# Patient Record
Sex: Male | Born: 1985 | Race: Black or African American | Hispanic: No | Marital: Married | Smoking: Never smoker
Health system: Southern US, Community
[De-identification: ages and names within clinical notes are randomized; demographics above are authoritative.]

## PROBLEM LIST (undated history)

## (undated) DIAGNOSIS — J45909 Unspecified asthma, uncomplicated: Secondary | ICD-10-CM

## (undated) DIAGNOSIS — N4400 Torsion of testis, unspecified: Secondary | ICD-10-CM

## (undated) HISTORY — PX: OTHER SURGICAL HISTORY: SHX169

## (undated) HISTORY — PX: FINGER AMPUTATION: SHX636

---
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
## 2009-05-30 ENCOUNTER — Emergency Department: Payer: Self-pay | Admitting: Emergency Medicine

## 2015-08-18 ENCOUNTER — Emergency Department (HOSPITAL_COMMUNITY): Payer: Self-pay

## 2015-08-18 ENCOUNTER — Emergency Department (HOSPITAL_COMMUNITY): Payer: Self-pay | Admitting: Certified Registered Nurse Anesthetist

## 2015-08-18 ENCOUNTER — Observation Stay (HOSPITAL_COMMUNITY)
Admission: EM | Admit: 2015-08-18 | Discharge: 2015-08-19 | Disposition: A | Discharge status: Home / Self Care | Payer: Self-pay | Attending: Orthopedic Surgery | Admitting: Orthopedic Surgery

## 2015-08-18 ENCOUNTER — Encounter (HOSPITAL_COMMUNITY)
Admission: EM | Disposition: A | Discharge status: Home / Self Care | Payer: Self-pay | Source: Home / Self Care | Attending: Emergency Medicine

## 2015-08-18 ENCOUNTER — Encounter (HOSPITAL_COMMUNITY): Payer: Self-pay | Admitting: *Deleted

## 2015-08-18 DIAGNOSIS — S6722XA Crushing injury of left hand, initial encounter: Principal | ICD-10-CM | POA: Insufficient documentation

## 2015-08-18 DIAGNOSIS — Y9389 Activity, other specified: Secondary | ICD-10-CM | POA: Insufficient documentation

## 2015-08-18 DIAGNOSIS — S61209A Unspecified open wound of unspecified finger without damage to nail, initial encounter: Secondary | ICD-10-CM

## 2015-08-18 DIAGNOSIS — Y9269 Other specified industrial and construction area as the place of occurrence of the external cause: Secondary | ICD-10-CM | POA: Insufficient documentation

## 2015-08-18 DIAGNOSIS — S68119A Complete traumatic metacarpophalangeal amputation of unspecified finger, initial encounter: Secondary | ICD-10-CM | POA: Diagnosis present

## 2015-08-18 DIAGNOSIS — W230XXA Caught, crushed, jammed, or pinched between moving objects, initial encounter: Secondary | ICD-10-CM | POA: Insufficient documentation

## 2015-08-18 DIAGNOSIS — S61319A Laceration without foreign body of unspecified finger with damage to nail, initial encounter: Secondary | ICD-10-CM

## 2015-08-18 DIAGNOSIS — S68627A Partial traumatic transphalangeal amputation of left little finger, initial encounter: Secondary | ICD-10-CM | POA: Insufficient documentation

## 2015-08-18 DIAGNOSIS — S62635B Displaced fracture of distal phalanx of left ring finger, initial encounter for open fracture: Secondary | ICD-10-CM | POA: Insufficient documentation

## 2015-08-18 DIAGNOSIS — Y990 Civilian activity done for income or pay: Secondary | ICD-10-CM | POA: Insufficient documentation

## 2015-08-18 HISTORY — PX: HAND SURGERY: SHX662

## 2015-08-18 HISTORY — DX: Unspecified asthma, uncomplicated: J45.909

## 2015-08-18 HISTORY — PX: I & D EXTREMITY: SHX5045

## 2015-08-18 HISTORY — DX: Torsion of testis, unspecified: N44.00

## 2015-08-18 SURGERY — IRRIGATION AND DEBRIDEMENT EXTREMITY
Anesthesia: General | Site: Hand | Laterality: Left

## 2015-08-18 MED ORDER — BUPIVACAINE HCL (PF) 0.25 % IJ SOLN
INTRAMUSCULAR | Status: DC | PRN
  Administered 2015-08-18: 6 mL

## 2015-08-18 MED ORDER — ONDANSETRON HCL 4 MG/2ML IJ SOLN
INTRAMUSCULAR | Status: DC | PRN
  Administered 2015-08-18: 4 mg via INTRAVENOUS

## 2015-08-18 MED ORDER — TETANUS-DIPHTHERIA TOXOIDS TD 5-2 LFU IM INJ: 0.5000 mL | INJECTION | Freq: Once | INTRAMUSCULAR | Status: DC

## 2015-08-18 MED ORDER — HYDROMORPHONE HCL 1 MG/ML IJ SOLN
1.0000 mg | INTRAMUSCULAR | Status: DC | PRN
  Administered 2015-08-19: 1 mg via INTRAVENOUS
  Filled 2015-08-18: qty 1

## 2015-08-18 MED ORDER — CEFAZOLIN IN D5W 1 GM/50ML IV SOLN
1.0000 g | Freq: Once | INTRAVENOUS | Status: AC
  Administered 2015-08-18: 1 g via INTRAVENOUS
  Filled 2015-08-18: qty 50

## 2015-08-18 MED ORDER — LACTATED RINGERS IV SOLN
INTRAVENOUS | Status: DC | PRN
  Administered 2015-08-18 (×2): via INTRAVENOUS

## 2015-08-18 MED ORDER — SODIUM CHLORIDE 0.9 % IR SOLN
Status: DC | PRN
  Administered 2015-08-18: 3000 mL

## 2015-08-18 MED ORDER — LIDOCAINE HCL (CARDIAC) 20 MG/ML IV SOLN
INTRAVENOUS | Status: DC | PRN
  Administered 2015-08-18: 50 mg via INTRAVENOUS

## 2015-08-18 MED ORDER — LIDOCAINE 2% (20 MG/ML) 5 ML SYRINGE
INTRAMUSCULAR | Status: AC
  Filled 2015-08-18: qty 5

## 2015-08-18 MED ORDER — TETANUS-DIPHTH-ACELL PERTUSSIS 5-2.5-18.5 LF-MCG/0.5 IM SUSP
0.5000 mL | Freq: Once | INTRAMUSCULAR | Status: AC
  Administered 2015-08-18: 0.5 mL via INTRAMUSCULAR
  Filled 2015-08-18: qty 0.5

## 2015-08-18 MED ORDER — ONDANSETRON HCL 4 MG/2ML IJ SOLN
INTRAMUSCULAR | Status: AC
  Filled 2015-08-18: qty 2

## 2015-08-18 MED ORDER — ONDANSETRON HCL 4 MG/2ML IJ SOLN
4.0000 mg | Freq: Once | INTRAMUSCULAR | Status: AC
  Administered 2015-08-18: 4 mg via INTRAVENOUS
  Filled 2015-08-18: qty 2

## 2015-08-18 MED ORDER — HYDROMORPHONE HCL 1 MG/ML IJ SOLN
1.0000 mg | Freq: Once | INTRAMUSCULAR | Status: AC
  Administered 2015-08-18: 1 mg via INTRAVENOUS
  Filled 2015-08-18: qty 1

## 2015-08-18 MED ORDER — HYDROMORPHONE HCL 1 MG/ML IJ SOLN: 0.2500 mg | INTRAMUSCULAR | Status: DC | PRN

## 2015-08-18 MED ORDER — CEFAZOLIN IN D5W 1 GM/50ML IV SOLN
INTRAVENOUS | Status: DC | PRN
  Administered 2015-08-18: 1 g via INTRAVENOUS

## 2015-08-18 MED ORDER — BUPIVACAINE HCL (PF) 0.25 % IJ SOLN
INTRAMUSCULAR | Status: AC
  Filled 2015-08-18: qty 30

## 2015-08-18 MED ORDER — SUCCINYLCHOLINE CHLORIDE 200 MG/10ML IV SOSY
PREFILLED_SYRINGE | INTRAVENOUS | Status: AC
  Filled 2015-08-18: qty 10

## 2015-08-18 MED ORDER — PROPOFOL 10 MG/ML IV BOLUS
INTRAVENOUS | Status: DC | PRN
  Administered 2015-08-18: 150 mg via INTRAVENOUS

## 2015-08-18 MED ORDER — PROPOFOL 10 MG/ML IV BOLUS
INTRAVENOUS | Status: AC
  Filled 2015-08-18: qty 20

## 2015-08-18 MED ORDER — MIDAZOLAM HCL 5 MG/5ML IJ SOLN
INTRAMUSCULAR | Status: DC | PRN
  Administered 2015-08-18: 2 mg via INTRAVENOUS

## 2015-08-18 MED ORDER — FENTANYL CITRATE (PF) 100 MCG/2ML IJ SOLN
INTRAMUSCULAR | Status: DC | PRN
Start: 2015-08-18 — End: 2015-08-18
  Administered 2015-08-18: 100 ug via INTRAVENOUS

## 2015-08-18 MED ORDER — FENTANYL CITRATE (PF) 250 MCG/5ML IJ SOLN
INTRAMUSCULAR | Status: AC
  Filled 2015-08-18: qty 5

## 2015-08-18 MED ORDER — LACTATED RINGERS IV SOLN
INTRAVENOUS | Status: DC
  Administered 2015-08-18: 17:00:00 via INTRAVENOUS

## 2015-08-18 SURGICAL SUPPLY — 38 items
BANDAGE ELASTIC 4 VELCRO ST LF (GAUZE/BANDAGES/DRESSINGS) ×3 IMPLANT
BNDG CONFORM 2 STRL LF (GAUZE/BANDAGES/DRESSINGS) ×3 IMPLANT
BNDG GAUZE ELAST 4 BULKY (GAUZE/BANDAGES/DRESSINGS) ×9 IMPLANT
CORDS BIPOLAR (ELECTRODE) ×3 IMPLANT
CUFF TOURNIQUET SINGLE 18IN (TOURNIQUET CUFF) ×3 IMPLANT
DRSG ADAPTIC 3X8 NADH LF (GAUZE/BANDAGES/DRESSINGS) ×3 IMPLANT
GAUZE SPONGE 4X4 12PLY STRL (GAUZE/BANDAGES/DRESSINGS) ×3 IMPLANT
GAUZE XEROFORM 5X9 LF (GAUZE/BANDAGES/DRESSINGS) ×3 IMPLANT
GLOVE BIOGEL M 8.0 STRL (GLOVE) ×3 IMPLANT
GLOVE SS BIOGEL STRL SZ 8 (GLOVE) ×1 IMPLANT
GLOVE SUPERSENSE BIOGEL SZ 8 (GLOVE) ×2
GOWN STRL REUS W/ TWL LRG LVL3 (GOWN DISPOSABLE) ×1 IMPLANT
GOWN STRL REUS W/ TWL XL LVL3 (GOWN DISPOSABLE) ×2 IMPLANT
GOWN STRL REUS W/TWL LRG LVL3 (GOWN DISPOSABLE) ×2
GOWN STRL REUS W/TWL XL LVL3 (GOWN DISPOSABLE) ×4
KIT BASIN OR (CUSTOM PROCEDURE TRAY) ×3 IMPLANT
KIT ROOM TURNOVER OR (KITS) ×3 IMPLANT
MANIFOLD NEPTUNE II (INSTRUMENTS) ×3 IMPLANT
NEEDLE HYPO 25GX1X1/2 BEV (NEEDLE) ×3 IMPLANT
NS IRRIG 1000ML POUR BTL (IV SOLUTION) ×3 IMPLANT
PACK ORTHO EXTREMITY (CUSTOM PROCEDURE TRAY) ×3 IMPLANT
PAD ARMBOARD 7.5X6 YLW CONV (MISCELLANEOUS) ×3 IMPLANT
PAD CAST 4YDX4 CTTN HI CHSV (CAST SUPPLIES) ×1 IMPLANT
PADDING CAST COTTON 4X4 STRL (CAST SUPPLIES) ×2
PADDING CAST SYNTHETIC 3 NS LF (CAST SUPPLIES) ×2
PADDING CAST SYNTHETIC 3X4 NS (CAST SUPPLIES) ×1 IMPLANT
SPLINT FIBERGLASS 3X12 (CAST SUPPLIES) ×3 IMPLANT
SPONGE LAP 4X18 X RAY DECT (DISPOSABLE) ×3 IMPLANT
SUT CHROMIC 6 0 PS 4 (SUTURE) ×6 IMPLANT
SUT PROLENE 4 0 P 3 18 (SUTURE) ×3 IMPLANT
SUT PROLENE 5 0 PS 2 (SUTURE) ×3 IMPLANT
SYR CONTROL 10ML LL (SYRINGE) ×3 IMPLANT
TOWEL OR 17X24 6PK STRL BLUE (TOWEL DISPOSABLE) ×3 IMPLANT
TOWEL OR 17X26 10 PK STRL BLUE (TOWEL DISPOSABLE) ×3 IMPLANT
TUBE CONNECTING 12'X1/4 (SUCTIONS) ×1
TUBE CONNECTING 12X1/4 (SUCTIONS) ×2 IMPLANT
WATER STERILE IRR 1000ML POUR (IV SOLUTION) ×3 IMPLANT
YANKAUER SUCT BULB TIP NO VENT (SUCTIONS) ×3 IMPLANT

## 2015-08-18 NOTE — ED Triage Notes (Signed)
Pt arrives via [facility]. Pt was at work and got his left hand caught in a cement truck. Pt had an amputation to the left pinky and a partial amputation to the left ring finger. Pt BP was soft and he was unable to receive pain meds PTA.

## 2015-08-18 NOTE — Transfer of Care (Signed)
Immediate Anesthesia Transfer of Care Note  Patient: Evan Landry  Procedure(s) Performed: Procedure(s) with comments: IRRIGATION AND DEBRIDEMENT OF HAND REVISION AND REPAIR AS NECESSARY (Left) - IRRIGATION AND DEBRIDEMENT OF HAND REVISION AND REPAIR AS NECESSARY  Patient Location: PACU  Anesthesia Type:General  Level of Consciousness: awake  Airway & Oxygen Therapy: Patient Spontanous Breathing and Patient connected to face mask oxygen  Post-op Assessment: Report given to RN and Post -op Vital signs reviewed and stable  Post vital signs: Reviewed and stable  Last Vitals:  Vitals:   08/18/15 1600 08/18/15 2103  BP: 114/71   Pulse: 68   Resp: 16   Temp:  (P) 36.4 C    Last Pain:  Vitals:   08/18/15 1454  PainSc: 6          Complications: No apparent anesthesia complications

## 2015-08-18 NOTE — Anesthesia Postprocedure Evaluation (Signed)
Anesthesia Post Note  Patient: Evan Landry  Procedure(s) Performed: Procedure(s) (LRB): IRRIGATION AND DEBRIDEMENT OF HAND REVISION AND REPAIR AS NECESSARY (Left)  Patient location during evaluation: PACU Anesthesia Type: General Level of consciousness: awake and alert Pain management: pain level controlled Vital Signs Assessment: post-procedure vital signs reviewed and stable Respiratory status: spontaneous breathing, nonlabored ventilation and respiratory function stable Cardiovascular status: blood pressure returned to baseline and stable Postop Assessment: no signs of nausea or vomiting Anesthetic complications: no    Last Vitals:  Vitals:   08/18/15 2130 08/18/15 2133  BP:  117/72  Pulse: (!) 49 (!) 48  Resp: 15 17  Temp:  36.6 C    Last Pain:  Vitals:   08/18/15 2133  PainSc: Asleep                 Amaira Safley,W. EDMOND

## 2015-08-18 NOTE — ED Notes (Signed)
Patient transported to X-ray 

## 2015-08-18 NOTE — Anesthesia Preprocedure Evaluation (Addendum)
Anesthesia Evaluation  Patient identified by MRN, date of birth, ID band Patient awake    Reviewed: Allergy & Precautions, H&P , NPO status , Patient's Chart, lab work & pertinent test results  Airway Mallampati: II  TM Distance: >3 FB Neck ROM: Full    Dental no notable dental hx. (+) Teeth Intact, Dental Advisory Given   Pulmonary neg pulmonary ROS,    Pulmonary exam normal breath sounds clear to auscultation       Cardiovascular negative cardio ROS   Rhythm:Regular Rate:Normal     Neuro/Psych negative neurological ROS  negative psych ROS   GI/Hepatic negative GI ROS, Neg liver ROS,   Endo/Other  negative endocrine ROS  Renal/GU negative Renal ROS  negative genitourinary   Musculoskeletal   Abdominal   Peds  Hematology negative hematology ROS (+)   Anesthesia Other Findings   Reproductive/Obstetrics negative OB ROS                            Anesthesia Physical Anesthesia Plan  ASA: I  Anesthesia Plan: General   Post-op Pain Management:    Induction: Intravenous  Airway Management Planned: LMA  Additional Equipment:   Intra-op Plan:   Post-operative Plan: Extubation in OR  Informed Consent: I have reviewed the patients History and Physical, chart, labs and discussed the procedure including the risks, benefits and alternatives for the proposed anesthesia with the patient or authorized representative who has indicated his/her understanding and acceptance.   Dental advisory given  Plan Discussed with: CRNA  Anesthesia Plan Comments:        Anesthesia Quick Evaluation  

## 2015-08-18 NOTE — ED Provider Notes (Addendum)
[facility]-EMERGENCY DEPT Provider Note   CSN: [id number] Arrival date & time: 08/18/15  1335  First Provider Contact:  First MD Initiated Contact with Patient 08/18/15 1337        History   Chief Complaint Chief Complaint  Patient presents with  . Hand Injury    HPI Evan Landry is a 30 y.o. male.  The history is provided by the patient.  Hand Injury   The incident occurred 1 to 2 hours ago. The incident occurred at work. Injury mechanism: fingers got caught in the blades of the concrete mixer. The pain is present in the left fingers. The quality of the pain is described as sharp and throbbing. The pain is at a severity of 8/10. The pain is severe. The pain has been constant since the incident. Associated symptoms comments: Normal sensation and movement. He reports no foreign bodies present. The symptoms are aggravated by palpation. He has tried immobilization for the symptoms. The treatment provided no relief.    Past Medical History:  Diagnosis Date  . Testicular torsion     There are no active problems to display for this patient.   Past Surgical History:  Procedure Laterality Date  . TESTICLE SURGERY         Home Medications    Prior to Admission medications   Not on File    Family History History reviewed. No pertinent family history.  Social History Social History  Substance Use Topics  . Smoking status: Never Smoker  . Smokeless tobacco: Never Used  . Alcohol use No     Allergies   Penicillins   Review of Systems Review of Systems  All other systems reviewed and are negative.    Physical Exam Updated Vital Signs SpO2 100%   Physical Exam  Constitutional: He is oriented to person, place, and time. He appears well-developed and well-nourished. No distress.  HENT:  Head: Normocephalic and atraumatic.  Eyes: EOM are normal. Pupils are equal, round, and reactive to light.  Cardiovascular: Normal rate.   Pulmonary/Chest: Effort normal.    Musculoskeletal: He exhibits tenderness.       Hands: Neurological: He is alert and oriented to person, place, and time.  Skin: Skin is warm and dry.  Psychiatric: He has a normal mood and affect. His behavior is normal.  Nursing note and vitals reviewed.    ED Treatments / Results  Labs (all labs ordered are listed, but only abnormal results are displayed) Labs Reviewed - No data to display  EKG  EKG Interpretation None       Radiology Dg Hand Complete Left  Result Date: 08/18/2015 CLINICAL DATA:  Crush injury left hand by a roller on a cement truck today. Pain. Initial encounter. EXAM: LEFT HAND - COMPLETE 3+ VIEW COMPARISON:  None. FINDINGS: Bandaging is present over the ring and little fingers. The patient has a comminuted fracture of the tuft of the ring finger. Large skin defect is seen over the distal aspect of the little finger with a large portion of the distal phalanx exposed. There may be a small fracture of the tip of the tuft of the little finger. No radiopaque foreign body is identified. No other bony or joint abnormality is seen. IMPRESSION: Largest skin defect distal left little finger. A portion of the distal phalanx appears exposed and there appears to be a fracture of the tip of the tuft. Comminuted fracture tuft of the left ring finger. Electronically Signed   By: Drusilla Kanner  M.D.   On: 08/18/2015 15:10   Procedures Procedures (including critical care time)  Medications Ordered in ED Medications  ceFAZolin (ANCEF) IVPB 1 g/50 mL premix (1 g Intravenous New Bag/Given 08/18/15 1354)  HYDROmorphone (DILAUDID) injection 1 mg (not administered)  ondansetron (ZOFRAN) injection 4 mg (not administered)  Tdap (BOOSTRIX) injection 0.5 mL (0.5 mLs Intramuscular Given 08/18/15 1354)     Initial Impression / Assessment and Plan / ED Course  I have reviewed the triage vital signs and the nursing notes.  Pertinent labs & imaging results that were available during  my care of the patient were reviewed by me and considered in my medical decision making (see chart for details).  Clinical Course   Pt with injury to the left hand after getting stuck in the concrete mixer.  Pt has partial amputation of the left little finger with bone protruding and no soft tissue present above the DIP joint and left ring finger with nailbed laceration.  Pt NPO since 8am and tdap updated.  Plain films pending.  Pt has no med problems and given pain control.  Pt given ancef for laceration and bone exposure  Final Clinical Impressions(s) / ED Diagnoses   Final diagnoses:  Nailbed laceration, finger, initial encounter  Finger avulsion, initial encounter    New Prescriptions New Prescriptions   No medications on file     Gwyneth Sprout, MD 08/18/15 1409    Gwyneth Sprout, MD 08/18/15 [phone]

## 2015-08-18 NOTE — Progress Notes (Signed)
Conversation with Dr. Glade Stanford that we will omit labs on this pt.

## 2015-08-18 NOTE — ED Notes (Signed)
Dr. Amanda Pea at bedside and has completed consent for surgery.

## 2015-08-18 NOTE — Op Note (Signed)
See [id number] Evan Pea MD

## 2015-08-18 NOTE — Anesthesia Procedure Notes (Signed)
Procedure Name: LMA Insertion Date/Time: 08/18/2015 8:08 PM Performed by: Gwenyth Allegra Pre-anesthesia Checklist: Patient identified, Emergency Drugs available, Suction available, Patient being monitored and Timeout performed Patient Re-evaluated:Patient Re-evaluated prior to inductionOxygen Delivery Method: Circle system utilized Preoxygenation: Pre-oxygenation with 100% oxygen Intubation Type: IV induction LMA: LMA inserted LMA Size: 4.0 Number of attempts: 1 Placement Confirmation: positive ETCO2 Tube secured with: Tape Dental Injury: Teeth and Oropharynx as per pre-operative assessment

## 2015-08-18 NOTE — H&P (Signed)
Evan Landry is an 30 y.o. male.   Chief Complaint: Status post on-the-job injury with left ring finger and left small finger partial and complete and dictations respectively HPI: Patient presents for evaluation and treatment of the of their upper extremity predicament. The patient denies neck, back, chest or  abdominal pain. The patient notes that they have no lower extremity problems. The patients primary complaint is noted. We are planning surgical care pathway for the upper extremity. This patient has a unfortunate injury to his hand with major structural loss. I've reviewed this with him at length. This was an on-the-job injury. He denies other issues.  Past Medical History:  Diagnosis Date  . Testicular torsion     Past Surgical History:  Procedure Laterality Date  . TESTICLE SURGERY      History reviewed. No pertinent family history. Social History:  reports that he has never smoked. He has never used smokeless tobacco. He reports that he does not drink alcohol or use drugs.  Allergies:  Allergies  Allergen Reactions  . Penicillins Swelling    No prescriptions prior to admission.    No results found for this or any previous visit (from the past 48 hour(s)). Dg Hand Complete Left  Result Date: 08/18/2015 CLINICAL DATA:  Crush injury left hand by a roller on a cement truck today. Pain. Initial encounter. EXAM: LEFT HAND - COMPLETE 3+ VIEW COMPARISON:  None. FINDINGS: Bandaging is present over the ring and little fingers. The patient has a comminuted fracture of the tuft of the ring finger. Large skin defect is seen over the distal aspect of the little finger with a large portion of the distal phalanx exposed. There may be a small fracture of the tip of the tuft of the little finger. No radiopaque foreign body is identified. No other bony or joint abnormality is seen. IMPRESSION: Largest skin defect distal left little finger. A portion of the distal phalanx appears exposed and  there appears to be a fracture of the tip of the tuft. Comminuted fracture tuft of the left ring finger. Electronically Signed   By: Drusilla Kanner M.D.   On: 08/18/2015 15:10   Review of Systems  Constitutional: Negative.   HENT: Negative.   Eyes: Negative.   Respiratory: Negative.   Cardiovascular: Negative.   Gastrointestinal: Negative.   Genitourinary: Negative.   Skin: Negative.   Endo/Heme/Allergies: Negative.     Blood pressure 114/71, pulse 68, resp. rate 16, SpO2 99 %. Physical Exam  Left ring finger near amputation with nailbed injury. Left small finger with amputation to the distal portion of the finger with exposed bone and loss of the nail nail plate. Remaining fingers are intact. X-rays of been reviewed.  The patient is alert and oriented in no acute distress. The patient complains of pain in the affected upper extremity.  The patient is noted to have a normal HEENT exam. Lung fields show equal chest expansion and no shortness of breath. Abdomen exam is nontender without distention. Lower extremity examination does not show any fracture dislocation or blood clot symptoms. Pelvis is stable and the neck and back are stable and nontender. Assessment/Plan Amputation to the left small finger and near amputation left ring finger will plan for the reconstruction is necessary All risk and benefits of been discussed with patient at length.  We are planning surgery for your upper extremity. The risk and benefits of surgery to include risk of bleeding, infection, anesthesia,  damage to normal structures and  failure of the surgery to accomplish its intended goals of relieving symptoms and restoring function have been discussed in detail. With this in mind we plan to proceed. I have specifically discussed with the patient the pre-and postoperative regime and the dos and don'ts and risk and benefits in great detail. Risk and benefits of surgery also include risk of dystrophy(CRPS),  chronic nerve pain, failure of the healing process to go onto completion and other inherent risks of surgery The relavent the pathophysiology of the disease/injury process, as well as the alternatives for treatment and postoperative course of action has been discussed in great detail with the patient who desires to proceed.  We will do everything in our power to help you (the patient) restore function to the upper extremity. It is a pleasure to see this patient today.  Karen Chafe, MD 08/18/2015, 4:40 PM

## 2015-08-19 MED ORDER — OXYCODONE HCL 5 MG PO TABS
5.0000 mg | ORAL_TABLET | ORAL | Status: DC | PRN
  Administered 2015-08-19 (×2): 10 mg via ORAL
  Filled 2015-08-19 (×2): qty 2

## 2015-08-19 MED ORDER — ONDANSETRON HCL 4 MG PO TABS: 4.0000 mg | ORAL_TABLET | Freq: Four times a day (QID) | ORAL | Status: DC | PRN

## 2015-08-19 MED ORDER — CEFAZOLIN IN D5W 1 GM/50ML IV SOLN
1.0000 g | INTRAVENOUS | Status: AC
  Administered 2015-08-19: 1 g via INTRAVENOUS
  Filled 2015-08-19: qty 50

## 2015-08-19 MED ORDER — OXYCODONE HCL 5 MG PO TABS: 5.0000 mg | ORAL_TABLET | ORAL | 0 refills | Status: DC | PRN

## 2015-08-19 MED ORDER — ALPRAZOLAM 0.5 MG PO TABS: 0.5000 mg | ORAL_TABLET | Freq: Four times a day (QID) | ORAL | Status: DC | PRN

## 2015-08-19 MED ORDER — VITAMIN C 500 MG PO TABS
1000.0000 mg | ORAL_TABLET | Freq: Every day | ORAL | Status: DC
  Administered 2015-08-19: 1000 mg via ORAL
  Filled 2015-08-19: qty 2

## 2015-08-19 MED ORDER — PROMETHAZINE HCL 25 MG RE SUPP: 12.5000 mg | Freq: Four times a day (QID) | RECTAL | Status: DC | PRN

## 2015-08-19 MED ORDER — MORPHINE SULFATE (PF) 2 MG/ML IV SOLN
1.0000 mg | INTRAVENOUS | Status: DC | PRN
  Administered 2015-08-19: 1 mg via INTRAVENOUS
  Filled 2015-08-19: qty 1

## 2015-08-19 MED ORDER — LACTATED RINGERS IV SOLN: INTRAVENOUS | Status: DC

## 2015-08-19 MED ORDER — FAMOTIDINE 20 MG PO TABS
20.0000 mg | ORAL_TABLET | Freq: Two times a day (BID) | ORAL | Status: DC | PRN
Start: 2015-08-19 — End: 2015-08-19

## 2015-08-19 MED ORDER — METHOCARBAMOL 1000 MG/10ML IJ SOLN: 500.0000 mg | Freq: Four times a day (QID) | INTRAVENOUS | Status: DC | PRN

## 2015-08-19 MED ORDER — CEFAZOLIN IN D5W 1 GM/50ML IV SOLN
1.0000 g | Freq: Three times a day (TID) | INTRAVENOUS | Status: DC
  Administered 2015-08-19: 1 g via INTRAVENOUS
  Filled 2015-08-19 (×3): qty 50

## 2015-08-19 MED ORDER — DOCUSATE SODIUM 100 MG PO CAPS
100.0000 mg | ORAL_CAPSULE | Freq: Two times a day (BID) | ORAL | Status: DC
  Administered 2015-08-19: 100 mg via ORAL
  Filled 2015-08-19: qty 1

## 2015-08-19 MED ORDER — ONDANSETRON HCL 4 MG/2ML IJ SOLN: 4.0000 mg | Freq: Four times a day (QID) | INTRAMUSCULAR | Status: DC | PRN

## 2015-08-19 MED ORDER — CEPHALEXIN 500 MG PO CAPS: 500.0000 mg | ORAL_CAPSULE | Freq: Four times a day (QID) | ORAL | 0 refills | Status: DC

## 2015-08-19 MED ORDER — METHOCARBAMOL 500 MG PO TABS
500.0000 mg | ORAL_TABLET | Freq: Four times a day (QID) | ORAL | Status: DC | PRN
  Administered 2015-08-19: 500 mg via ORAL
  Filled 2015-08-19: qty 1

## 2015-08-19 NOTE — Op Note (Signed)
NAMEMarland Kitchen  XAYVIER, MOCCIA NO.:  [id number]  MEDICAL RECORD NO.:  [id number]  LOCATION:  5N14C                        FACILITY:  [facility]  PHYSICIAN:  Dionne Ano. Saleemah Mollenhauer, M.D.DATE OF BIRTH:  [birth year]  DATE OF PROCEDURE: DATE OF DISCHARGE:                              OPERATIVE REPORT   PREOPERATIVE DIAGNOSES: 1. 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 2. Left ring finger open fracture with nail bed disarray and nail     plate injury.  POSTOPERATIVE DIAGNOSES: 1. 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 2. Left ring finger open fracture with nail bed disarray and nail     plate injury.  PROCEDURE: 1. I and D (irrigation and debridement of skin, subcutaneous tissue,     bone, and soft tissue), this was excisional debridement in nature,     left ring finger. 2. I and D skin, subcutaneous tissue and open fracture about the left     small finger, this was an excisional debridement with curette,     knife, blade, and scissor tip. 3. Nail bed repair, left ring finger. 4. Open treatment distal phalanx fracture, left ring finger. 5. Revision, amputation with volar advancement flap and rotation flap     coverage left small finger. 6. Nail bed excision and ablation, left small finger.  SURGEON:  Dionne Ano. Amanda Pea, MD  ASSISTANT:  None.  COMPLICATIONS:  None.  ANESTHESIA:  General.  TOURNIQUET TIME:  Less than an hour.  INDICATIONS:  This patient sustained an on-the-job injury with crushing injury to the ring and small finger.  Both fingers have fractures which are open and significant soft tissue disarray.  Unfortunately, the small finger has exposed bone, tremendous loss of skin tissue.  OPERATIVE PROCEDURE:  The patient was seen by myself and Anesthesia, taken to operative suite and underwent smooth induction of general anesthesia, prepped and draped in usual sterile fashion with Hibiclens pre-scrub followed by surgical  Betadine scrub and paint.  Time-out was observed.  Preoperative antibiotics were given in the form of Ancef. The patient tolerated this well.  The patient then underwent irrigation and debridement of skin, subcutaneous tissue, bone, secondary to open fracture about the small finger.  This was an excisional debridement with curette, knife, blade, and scissor.  Following this, I and D of the ring finger, skin, subcutaneous tissue, bone, and nail bed and nail plate, tissue was accomplished with curette, knife, blade, and scissor.  This was an excisional debridement.  3 L of saline were placed through and through the area.  Following this, I performed an open treatment and setting technique at the ring finger without difficulty.  Following this, I performed complex nail bed repair with 6-0 chromic about the left ring finger.  Adaptic was placed on the eponychial fold.  There were no complicating features.  Following this, I performed revision, amputation with bilateral neurectomies and a rotation flap, and volar flap advancement flap closure of the ring finger.  I was able to achieve excellent soft tissue parameters and did perform a nail bed ablation.  The nail bed was identified and ablated with the primary excision.  I tried to get all the remnants of  this area so that there would be no remnants left behind.  The rotation flap on the small finger was inset without difficulty. There were no complicating features. Tourniquet was deflated.  Sensorcaine 9 mL was placed in equal amounts about the flexor tendon sheath of the ring and small finger, left hand. Sterile dressing of Adaptic, Xeroform, 4x4s gauze, and splint was applied without difficulty.  There were no complicating features.  All sponge, needle, and instrument counts were correct.  The patient tolerated this well.  There were no complicating features.  We look forward to seeing him back in the office in 10-14 days.  He  will be admitted overnight for IV antibiotics, given the timeframe duration from injury to surgery and the nature of his wound in general.     Dionne Ano. Amanda Pea, M.D.     [facility]  D:  08/18/2015  T:  08/19/2015  Job:  [id number]

## 2015-08-19 NOTE — Discharge Summary (Signed)
  Patient has been seen and examined. Patient has pain appropriate to his injury/process. Patient denies new complaints at this present time. I have discussed the care pathway with nursing staff. Patient is appropriate and alert.  We reviewed vital signs and intake output which are stable.  The upper extremity is neurovascularly intact. Refill is normal. There is no signs of compartment syndrome. There is no signs of dystrophy. There is normal sensation.  I have spent a  great deal of time discussing range of motion edema control and other techniques to decrease edema and promote flexion extension of the fingers. Patient understands the importance of elevation range of motion massage and other measures to lessen pain and prevent swelling.  We have also discussed immobilization to appropriate areas involved.  We have discussed with the patient shoulder range of motion to prevent adhesive capsulitis.  The remainder of the examination is normal today without complicating feature.   Patient will be discharged home. Will plan to see the patient back in the office as per discharge instructions (please see discharge instructions).  Patient had an uneventful hospital course. At the time of discharge patient is stable awake alert and oriented in no acute distress. Regular diet will be continued and has been tolerated. Patient will notify should have problems occur. There is no signs of DVT infection or other complication at this juncture.  All questions have been incurred and answered.  Please see discharge med list Discharge medicines: Keflex 500 4 times a day 10 days, OxyIR when necessary pain by mouth 1-2 every 4-6 hours.  Final diagnosis status post reconstruction left small and ring finger secondary to crush and amputation injury Cyndy Braver MD

## 2015-08-19 NOTE — Discharge Instructions (Signed)

## 2015-08-21 ENCOUNTER — Encounter (HOSPITAL_COMMUNITY): Payer: Self-pay | Admitting: Orthopedic Surgery

## 2016-11-02 ENCOUNTER — Emergency Department (HOSPITAL_COMMUNITY): Payer: BLUE CROSS/BLUE SHIELD

## 2016-11-02 ENCOUNTER — Emergency Department (HOSPITAL_COMMUNITY): DRG: 958 | Payer: BLUE CROSS/BLUE SHIELD | Attending: General Surgery

## 2016-11-02 ENCOUNTER — Inpatient Hospital Stay (HOSPITAL_COMMUNITY)
Admission: EM | Admit: 2016-11-02 | Discharge: 2016-11-16 | Disposition: A | Discharge status: Rehabilitation Facility | Payer: BLUE CROSS/BLUE SHIELD | Source: Home / Self Care

## 2016-11-02 ENCOUNTER — Encounter (HOSPITAL_COMMUNITY): Payer: Self-pay | Admitting: *Deleted

## 2016-11-02 DIAGNOSIS — I10 Essential (primary) hypertension: Secondary | ICD-10-CM | POA: Diagnosis present

## 2016-11-02 DIAGNOSIS — S32810A Multiple fractures of pelvis with stable disruption of pelvic ring, initial encounter for closed fracture: Secondary | ICD-10-CM

## 2016-11-02 DIAGNOSIS — S3022XA Contusion of scrotum and testes, initial encounter: Secondary | ICD-10-CM | POA: Diagnosis present

## 2016-11-02 DIAGNOSIS — S32599A Other specified fracture of unspecified pubis, initial encounter for closed fracture: Secondary | ICD-10-CM | POA: Diagnosis present

## 2016-11-02 DIAGNOSIS — R402362 Coma scale, best motor response, obeys commands, at arrival to emergency department: Secondary | ICD-10-CM

## 2016-11-02 DIAGNOSIS — S32511A Fracture of superior rim of right pubis, initial encounter for closed fracture: Secondary | ICD-10-CM

## 2016-11-02 DIAGNOSIS — R402252 Coma scale, best verbal response, oriented, at arrival to emergency department: Secondary | ICD-10-CM

## 2016-11-02 DIAGNOSIS — S32401A Unspecified fracture of right acetabulum, initial encounter for closed fracture: Secondary | ICD-10-CM

## 2016-11-02 DIAGNOSIS — S32402A Unspecified fracture of left acetabulum, initial encounter for closed fracture: Secondary | ICD-10-CM | POA: Diagnosis present

## 2016-11-02 DIAGNOSIS — Z880 Allergy status to penicillin: Secondary | ICD-10-CM | POA: Diagnosis not present

## 2016-11-02 DIAGNOSIS — S62511A Displaced fracture of proximal phalanx of right thumb, initial encounter for closed fracture: Secondary | ICD-10-CM | POA: Diagnosis present

## 2016-11-02 DIAGNOSIS — D62 Acute posthemorrhagic anemia: Secondary | ICD-10-CM | POA: Diagnosis not present

## 2016-11-02 DIAGNOSIS — K567 Ileus, unspecified: Secondary | ICD-10-CM

## 2016-11-02 DIAGNOSIS — S52602A Unspecified fracture of lower end of left ulna, initial encounter for closed fracture: Secondary | ICD-10-CM

## 2016-11-02 DIAGNOSIS — D72829 Elevated white blood cell count, unspecified: Secondary | ICD-10-CM

## 2016-11-02 DIAGNOSIS — T148XXA Other injury of unspecified body region, initial encounter: Secondary | ICD-10-CM

## 2016-11-02 DIAGNOSIS — S52552A Other extraarticular fracture of lower end of left radius, initial encounter for closed fracture: Principal | ICD-10-CM

## 2016-11-02 DIAGNOSIS — Z419 Encounter for procedure for purposes other than remedying health state, unspecified: Secondary | ICD-10-CM

## 2016-11-02 DIAGNOSIS — Z89022 Acquired absence of left finger(s): Secondary | ICD-10-CM | POA: Diagnosis not present

## 2016-11-02 DIAGNOSIS — R000 Tachycardia, unspecified: Secondary | ICD-10-CM | POA: Diagnosis present

## 2016-11-02 DIAGNOSIS — S32512A Fracture of superior rim of left pubis, initial encounter for closed fracture: Secondary | ICD-10-CM

## 2016-11-02 DIAGNOSIS — M25431 Effusion, right wrist: Secondary | ICD-10-CM

## 2016-11-02 DIAGNOSIS — Z4659 Encounter for fitting and adjustment of other gastrointestinal appliance and device: Secondary | ICD-10-CM

## 2016-11-02 DIAGNOSIS — S62202A Unspecified fracture of first metacarpal bone, left hand, initial encounter for closed fracture: Secondary | ICD-10-CM | POA: Diagnosis present

## 2016-11-02 DIAGNOSIS — S62300A Unspecified fracture of second metacarpal bone, right hand, initial encounter for closed fracture: Secondary | ICD-10-CM | POA: Diagnosis present

## 2016-11-02 DIAGNOSIS — S62102A Fracture of unspecified carpal bone, left wrist, initial encounter for closed fracture: Secondary | ICD-10-CM

## 2016-11-02 DIAGNOSIS — R402142 Coma scale, eyes open, spontaneous, at arrival to emergency department: Secondary | ICD-10-CM

## 2016-11-02 DIAGNOSIS — K560 Paralytic ileus: Secondary | ICD-10-CM

## 2016-11-02 DIAGNOSIS — S36114A Minor laceration of liver, initial encounter: Secondary | ICD-10-CM | POA: Diagnosis present

## 2016-11-02 DIAGNOSIS — R0602 Shortness of breath: Secondary | ICD-10-CM

## 2016-11-02 LAB — COMPREHENSIVE METABOLIC PANEL
ALBUMIN: 3.3 g/dL — AB (ref 3.5–5.0)
ALT: 39 U/L (ref 17–63)
AST: 48 U/L — AB (ref 15–41)
Alkaline Phosphatase: 58 U/L (ref 38–126)
Anion gap: 7 (ref 5–15)
BILIRUBIN TOTAL: 0.5 mg/dL (ref 0.3–1.2)
BUN: 12 mg/dL (ref 6–20)
CHLORIDE: 109 mmol/L (ref 101–111)
CO2: 19 mmol/L — ABNORMAL LOW (ref 22–32)
Calcium: 7.5 mg/dL — ABNORMAL LOW (ref 8.9–10.3)
Creatinine, Ser: 0.99 mg/dL (ref 0.61–1.24)
GFR calc Af Amer: >60 mL/min (ref >60)
GFR calc non Af Amer: >60 mL/min (ref >60)
GLUCOSE: 131 mg/dL — AB (ref 65–99)
POTASSIUM: 3.4 mmol/L — AB (ref 3.5–5.1)
Sodium: 135 mmol/L (ref 135–145)
Total Protein: 6.5 g/dL (ref 6.5–8.1)

## 2016-11-02 LAB — CBC
HEMATOCRIT: 37 % — AB (ref 39.0–52.0)
Hemoglobin: 12 g/dL — ABNORMAL LOW (ref 13.0–17.0)
MCH: 26.7 pg (ref 26.0–34.0)
MCHC: 32.4 g/dL (ref 30.0–36.0)
MCV: 82.2 fL (ref 78.0–100.0)
Platelets: 221 10*3/uL (ref 150–400)
RBC: 4.5 MIL/uL (ref 4.22–5.81)
RDW: 12.3 % (ref 11.5–15.5)
WBC: 14.2 10*3/uL — ABNORMAL HIGH (ref 4.0–10.5)

## 2016-11-02 LAB — I-STAT CHEM 8, ED
BUN: 13 mg/dL (ref 6–20)
Calcium, Ion: 1.04 mmol/L — ABNORMAL LOW (ref 1.15–1.40)
Chloride: 107 mmol/L (ref 101–111)
Creatinine, Ser: 1.1 mg/dL (ref 0.61–1.24)
Glucose, Bld: 129 mg/dL — ABNORMAL HIGH (ref 65–99)
HEMATOCRIT: 36 % — AB (ref 39.0–52.0)
HEMOGLOBIN: 12.2 g/dL — AB (ref 13.0–17.0)
POTASSIUM: 3.5 mmol/L (ref 3.5–5.1)
SODIUM: 141 mmol/L (ref 135–145)
TCO2: 22 mmol/L (ref 22–32)

## 2016-11-02 LAB — SAMPLE TO BLOOD BANK

## 2016-11-02 LAB — PROTIME-INR
INR: 1.11
Prothrombin Time: 14.2 seconds (ref 11.4–15.2)

## 2016-11-02 LAB — ETHANOL: Alcohol, Ethyl (B): <10 mg/dL (ref <10)

## 2016-11-02 LAB — I-STAT CG4 LACTIC ACID, ED: Lactic Acid, Venous: 2.01 mmol/L (ref 0.5–1.9)

## 2016-11-02 LAB — CDS SEROLOGY

## 2016-11-02 MED ORDER — FENTANYL CITRATE (PF) 100 MCG/2ML IJ SOLN
50.0000 ug | INTRAMUSCULAR | Status: DC | PRN
Start: 2016-11-02 — End: 2016-11-02

## 2016-11-02 MED ORDER — IOPAMIDOL (ISOVUE-300) INJECTION 61%
100.0000 mL | Freq: Once | INTRAVENOUS | Status: AC | PRN
  Administered 2016-11-02: 100 mL via INTRAVENOUS

## 2016-11-02 MED ORDER — FENTANYL CITRATE (PF) 100 MCG/2ML IJ SOLN
INTRAMUSCULAR | Status: AC | PRN
  Administered 2016-11-02: 50 ug via INTRAVENOUS

## 2016-11-02 MED ORDER — HYDROMORPHONE HCL 1 MG/ML IJ SOLN
1.0000 mg | INTRAMUSCULAR | Status: DC | PRN
  Administered 2016-11-02: 1 mg via INTRAVENOUS
  Filled 2016-11-02: qty 1

## 2016-11-02 MED ORDER — LIDOCAINE HCL (PF) 1 % IJ SOLN
30.0000 mL | Freq: Once | INTRAMUSCULAR | Status: AC
  Administered 2016-11-02: 30 mL
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
## 2016-11-02 NOTE — ED Notes (Signed)
Dr.Kuzma at bedside for nerve block to L wrist and splint placement
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
## 2016-11-02 NOTE — Progress Notes (Signed)
Introduced myself to the patient.  Patient's wife has been back and visited with patient.  Family members are now back in main waiting area.  Chaplain is available should additional support be needed.    11/02/16 2256  Clinical Encounter Type  Visited With Patient;Health care provider Lowanda Foster, RN at bedside providing care)  Visit Type Initial;Spiritual support;Trauma

## 2016-11-03 ENCOUNTER — Inpatient Hospital Stay (HOSPITAL_COMMUNITY): Payer: BLUE CROSS/BLUE SHIELD

## 2016-11-03 ENCOUNTER — Inpatient Hospital Stay (HOSPITAL_COMMUNITY): Payer: BLUE CROSS/BLUE SHIELD | Admitting: Certified Registered Nurse Anesthetist

## 2016-11-03 ENCOUNTER — Encounter (HOSPITAL_COMMUNITY): Admission: EM | Disposition: A | Discharge status: Rehabilitation Facility | Payer: Self-pay | Source: Home / Self Care

## 2016-11-03 ENCOUNTER — Encounter (HOSPITAL_COMMUNITY): Payer: Self-pay | Admitting: Surgery

## 2016-11-03 DIAGNOSIS — S32599A Other specified fracture of unspecified pubis, initial encounter for closed fracture: Secondary | ICD-10-CM | POA: Diagnosis present

## 2016-11-03 DIAGNOSIS — K567 Ileus, unspecified: Secondary | ICD-10-CM | POA: Diagnosis present

## 2016-11-03 DIAGNOSIS — S52552A Other extraarticular fracture of lower end of left radius, initial encounter for closed fracture: Secondary | ICD-10-CM | POA: Diagnosis present

## 2016-11-03 DIAGNOSIS — Z89022 Acquired absence of left finger(s): Secondary | ICD-10-CM | POA: Diagnosis not present

## 2016-11-03 DIAGNOSIS — Z880 Allergy status to penicillin: Secondary | ICD-10-CM | POA: Diagnosis not present

## 2016-11-03 DIAGNOSIS — M79609 Pain in unspecified limb: Secondary | ICD-10-CM | POA: Diagnosis not present

## 2016-11-03 DIAGNOSIS — S3022XA Contusion of scrotum and testes, initial encounter: Secondary | ICD-10-CM | POA: Diagnosis present

## 2016-11-03 DIAGNOSIS — S32512A Fracture of superior rim of left pubis, initial encounter for closed fracture: Secondary | ICD-10-CM | POA: Diagnosis present

## 2016-11-03 DIAGNOSIS — R740 Nonspecific elevation of levels of transaminase and lactic acid dehydrogenase [LDH]: Secondary | ICD-10-CM | POA: Diagnosis present

## 2016-11-03 DIAGNOSIS — R402362 Coma scale, best motor response, obeys commands, at arrival to emergency department: Secondary | ICD-10-CM | POA: Diagnosis present

## 2016-11-03 DIAGNOSIS — M7989 Other specified soft tissue disorders: Secondary | ICD-10-CM | POA: Diagnosis not present

## 2016-11-03 DIAGNOSIS — G8918 Other acute postprocedural pain: Secondary | ICD-10-CM | POA: Diagnosis not present

## 2016-11-03 DIAGNOSIS — R402252 Coma scale, best verbal response, oriented, at arrival to emergency department: Secondary | ICD-10-CM | POA: Diagnosis present

## 2016-11-03 DIAGNOSIS — S52502D Unspecified fracture of the lower end of left radius, subsequent encounter for closed fracture with routine healing: Secondary | ICD-10-CM | POA: Diagnosis not present

## 2016-11-03 DIAGNOSIS — S52602A Unspecified fracture of lower end of left ulna, initial encounter for closed fracture: Secondary | ICD-10-CM | POA: Diagnosis present

## 2016-11-03 DIAGNOSIS — K9189 Other postprocedural complications and disorders of digestive system: Secondary | ICD-10-CM | POA: Diagnosis not present

## 2016-11-03 DIAGNOSIS — M62838 Other muscle spasm: Secondary | ICD-10-CM | POA: Diagnosis present

## 2016-11-03 DIAGNOSIS — S62300D Unspecified fracture of second metacarpal bone, right hand, subsequent encounter for fracture with routine healing: Secondary | ICD-10-CM | POA: Diagnosis not present

## 2016-11-03 DIAGNOSIS — S32810A Multiple fractures of pelvis with stable disruption of pelvic ring, initial encounter for closed fracture: Secondary | ICD-10-CM | POA: Diagnosis not present

## 2016-11-03 DIAGNOSIS — S32810S Multiple fractures of pelvis with stable disruption of pelvic ring, sequela: Secondary | ICD-10-CM | POA: Diagnosis not present

## 2016-11-03 DIAGNOSIS — D72829 Elevated white blood cell count, unspecified: Secondary | ICD-10-CM | POA: Diagnosis present

## 2016-11-03 DIAGNOSIS — S62300A Unspecified fracture of second metacarpal bone, right hand, initial encounter for closed fracture: Secondary | ICD-10-CM | POA: Diagnosis present

## 2016-11-03 DIAGNOSIS — S32401A Unspecified fracture of right acetabulum, initial encounter for closed fracture: Secondary | ICD-10-CM | POA: Diagnosis present

## 2016-11-03 DIAGNOSIS — S32402D Unspecified fracture of left acetabulum, subsequent encounter for fracture with routine healing: Secondary | ICD-10-CM | POA: Diagnosis not present

## 2016-11-03 DIAGNOSIS — E871 Hypo-osmolality and hyponatremia: Secondary | ICD-10-CM | POA: Diagnosis present

## 2016-11-03 DIAGNOSIS — D62 Acute posthemorrhagic anemia: Secondary | ICD-10-CM | POA: Diagnosis present

## 2016-11-03 DIAGNOSIS — S36114A Minor laceration of liver, initial encounter: Secondary | ICD-10-CM | POA: Diagnosis present

## 2016-11-03 DIAGNOSIS — S62511A Displaced fracture of proximal phalanx of right thumb, initial encounter for closed fracture: Secondary | ICD-10-CM | POA: Diagnosis present

## 2016-11-03 DIAGNOSIS — S32401D Unspecified fracture of right acetabulum, subsequent encounter for fracture with routine healing: Secondary | ICD-10-CM | POA: Diagnosis not present

## 2016-11-03 DIAGNOSIS — S32810D Multiple fractures of pelvis with stable disruption of pelvic ring, subsequent encounter for fracture with routine healing: Secondary | ICD-10-CM | POA: Diagnosis present

## 2016-11-03 DIAGNOSIS — T148XXA Other injury of unspecified body region, initial encounter: Secondary | ICD-10-CM | POA: Diagnosis not present

## 2016-11-03 DIAGNOSIS — S62202A Unspecified fracture of first metacarpal bone, left hand, initial encounter for closed fracture: Secondary | ICD-10-CM | POA: Diagnosis present

## 2016-11-03 DIAGNOSIS — S32511A Fracture of superior rim of right pubis, initial encounter for closed fracture: Secondary | ICD-10-CM | POA: Diagnosis present

## 2016-11-03 DIAGNOSIS — R402142 Coma scale, eyes open, spontaneous, at arrival to emergency department: Secondary | ICD-10-CM | POA: Diagnosis present

## 2016-11-03 DIAGNOSIS — I10 Essential (primary) hypertension: Secondary | ICD-10-CM | POA: Diagnosis present

## 2016-11-03 DIAGNOSIS — S62202D Unspecified fracture of first metacarpal bone, left hand, subsequent encounter for fracture with routine healing: Secondary | ICD-10-CM | POA: Diagnosis not present

## 2016-11-03 DIAGNOSIS — T07XXXA Unspecified multiple injuries, initial encounter: Secondary | ICD-10-CM | POA: Diagnosis not present

## 2016-11-03 DIAGNOSIS — R000 Tachycardia, unspecified: Secondary | ICD-10-CM | POA: Diagnosis present

## 2016-11-03 DIAGNOSIS — S32402A Unspecified fracture of left acetabulum, initial encounter for closed fracture: Secondary | ICD-10-CM | POA: Diagnosis present

## 2016-11-03 HISTORY — PX: ORIF PELVIC FRACTURE: SHX2128

## 2016-11-03 HISTORY — PX: OPEN REDUCTION INTERNAL FIXATION (ORIF) DISTAL RADIAL FRACTURE: SHX5989

## 2016-11-03 LAB — URINALYSIS, ROUTINE W REFLEX MICROSCOPIC
Bilirubin Urine: NEGATIVE
GLUCOSE, UA: NEGATIVE mg/dL
Ketones, ur: 5 mg/dL — AB
LEUKOCYTES UA: NEGATIVE
NITRITE: NEGATIVE
Protein, ur: 100 mg/dL — AB
Squamous Epithelial / LPF: NONE SEEN
pH: 5 (ref 5.0–8.0)

## 2016-11-03 LAB — BASIC METABOLIC PANEL
ANION GAP: 10 (ref 5–15)
BUN: 14 mg/dL (ref 6–20)
CO2: 19 mmol/L — ABNORMAL LOW (ref 22–32)
Calcium: 7.7 mg/dL — ABNORMAL LOW (ref 8.9–10.3)
Chloride: 109 mmol/L (ref 101–111)
Creatinine, Ser: 1.18 mg/dL (ref 0.61–1.24)
GFR calc Af Amer: >60 mL/min (ref >60)
GFR calc non Af Amer: >60 mL/min (ref >60)
GLUCOSE: 137 mg/dL — AB (ref 65–99)
POTASSIUM: 3.6 mmol/L (ref 3.5–5.1)
Sodium: 138 mmol/L (ref 135–145)

## 2016-11-03 LAB — PROTIME-INR
INR: 1.25
PROTHROMBIN TIME: 15.6 s — AB (ref 11.4–15.2)

## 2016-11-03 LAB — CBC
HCT: 34.8 % — ABNORMAL LOW (ref 39.0–52.0)
Hemoglobin: 11 g/dL — ABNORMAL LOW (ref 13.0–17.0)
MCH: 26.3 pg (ref 26.0–34.0)
MCHC: 31.6 g/dL (ref 30.0–36.0)
MCV: 83.1 fL (ref 78.0–100.0)
PLATELETS: 229 10*3/uL (ref 150–400)
RBC: 4.19 MIL/uL — AB (ref 4.22–5.81)
RDW: 12.5 % (ref 11.5–15.5)
WBC: 12.3 10*3/uL — AB (ref 4.0–10.5)

## 2016-11-03 LAB — PREPARE RBC (CROSSMATCH)

## 2016-11-03 LAB — MRSA PCR SCREENING: MRSA BY PCR: NEGATIVE

## 2016-11-03 LAB — ABO/RH: ABO/RH(D): A POS

## 2016-11-03 LAB — HIV ANTIBODY (ROUTINE TESTING W REFLEX): HIV Screen 4th Generation wRfx: NONREACTIVE

## 2016-11-03 LAB — APTT: APTT: 27 s (ref 24–36)

## 2016-11-03 SURGERY — OPEN REDUCTION INTERNAL FIXATION (ORIF) PELVIC FRACTURE
Anesthesia: General | Site: Pelvis

## 2016-11-03 MED ORDER — OXYCODONE HCL 5 MG PO TABS: 5.0000 mg | ORAL_TABLET | Freq: Once | ORAL | Status: DC | PRN

## 2016-11-03 MED ORDER — ONDANSETRON HCL 4 MG/2ML IJ SOLN: 4.0000 mg | Freq: Four times a day (QID) | INTRAMUSCULAR | Status: DC | PRN

## 2016-11-03 MED ORDER — DEXAMETHASONE SODIUM PHOSPHATE 10 MG/ML IJ SOLN
INTRAMUSCULAR | Status: AC
  Filled 2016-11-03: qty 1

## 2016-11-03 MED ORDER — OXYCODONE HCL 5 MG/5ML PO SOLN: 5.0000 mg | Freq: Once | ORAL | Status: DC | PRN

## 2016-11-03 MED ORDER — ORAL CARE MOUTH RINSE
15.0000 mL | Freq: Two times a day (BID) | OROMUCOSAL | Status: DC
  Administered 2016-11-03 – 2016-11-16 (×18): 15 mL via OROMUCOSAL

## 2016-11-03 MED ORDER — FENTANYL CITRATE (PF) 100 MCG/2ML IJ SOLN
INTRAMUSCULAR | Status: AC
  Filled 2016-11-03: qty 2

## 2016-11-03 MED ORDER — FENTANYL CITRATE (PF) 250 MCG/5ML IJ SOLN
INTRAMUSCULAR | Status: AC
  Filled 2016-11-03: qty 5

## 2016-11-03 MED ORDER — MIDAZOLAM HCL 2 MG/2ML IJ SOLN
INTRAMUSCULAR | Status: AC
  Filled 2016-11-03: qty 2

## 2016-11-03 MED ORDER — PROPOFOL 10 MG/ML IV BOLUS
INTRAVENOUS | Status: AC
  Filled 2016-11-03: qty 20

## 2016-11-03 MED ORDER — ROCURONIUM BROMIDE 50 MG/5ML IV SOLN
INTRAVENOUS | Status: AC
  Filled 2016-11-03: qty 1

## 2016-11-03 MED ORDER — 0.9 % SODIUM CHLORIDE (POUR BTL) OPTIME
TOPICAL | Status: DC | PRN
  Administered 2016-11-03 (×2): 1000 mL

## 2016-11-03 MED ORDER — FENTANYL CITRATE (PF) 100 MCG/2ML IJ SOLN
INTRAMUSCULAR | Status: DC | PRN
  Administered 2016-11-03: 150 ug via INTRAVENOUS
  Administered 2016-11-03 (×3): 50 ug via INTRAVENOUS

## 2016-11-03 MED ORDER — LIDOCAINE HCL (CARDIAC) 20 MG/ML IV SOLN
INTRAVENOUS | Status: DC | PRN
  Administered 2016-11-03: 50 mg via INTRAVENOUS

## 2016-11-03 MED ORDER — PANTOPRAZOLE SODIUM 40 MG IV SOLR
40.0000 mg | Freq: Every day | INTRAVENOUS | Status: DC
  Administered 2016-11-03 – 2016-11-12 (×8): 40 mg via INTRAVENOUS
  Filled 2016-11-03 (×9): qty 40

## 2016-11-03 MED ORDER — MIDAZOLAM HCL 5 MG/5ML IJ SOLN
INTRAMUSCULAR | Status: DC | PRN
  Administered 2016-11-03 (×2): 1 mg via INTRAVENOUS

## 2016-11-03 MED ORDER — PHENYLEPHRINE 40 MCG/ML (10ML) SYRINGE FOR IV PUSH (FOR BLOOD PRESSURE SUPPORT)
PREFILLED_SYRINGE | INTRAVENOUS | Status: DC | PRN
  Administered 2016-11-03: 80 ug via INTRAVENOUS
  Administered 2016-11-03 (×2): 40 ug via INTRAVENOUS
  Administered 2016-11-03 (×2): 80 ug via INTRAVENOUS
  Administered 2016-11-03: 40 ug via INTRAVENOUS

## 2016-11-03 MED ORDER — ONDANSETRON HCL 4 MG/2ML IJ SOLN
INTRAMUSCULAR | Status: AC
  Filled 2016-11-03: qty 2

## 2016-11-03 MED ORDER — LACTATED RINGERS IV SOLN
INTRAVENOUS | Status: DC | PRN
  Administered 2016-11-03 (×2): via INTRAVENOUS

## 2016-11-03 MED ORDER — OXYCODONE HCL 5 MG PO TABS
5.0000 mg | ORAL_TABLET | ORAL | Status: DC | PRN
  Administered 2016-11-03 – 2016-11-04 (×2): 5 mg via ORAL
  Filled 2016-11-03 (×2): qty 1

## 2016-11-03 MED ORDER — SUGAMMADEX SODIUM 200 MG/2ML IV SOLN
INTRAVENOUS | Status: DC | PRN
  Administered 2016-11-03: 200 mg via INTRAVENOUS

## 2016-11-03 MED ORDER — PROPOFOL 10 MG/ML IV BOLUS
INTRAVENOUS | Status: DC | PRN
  Administered 2016-11-03: 100 mg via INTRAVENOUS

## 2016-11-03 MED ORDER — 0.9 % SODIUM CHLORIDE (POUR BTL) OPTIME
TOPICAL | Status: DC | PRN
  Administered 2016-11-03: 1000 mL

## 2016-11-03 MED ORDER — ACETAMINOPHEN 325 MG PO TABS: 650.0000 mg | ORAL_TABLET | ORAL | Status: DC | PRN

## 2016-11-03 MED ORDER — PANTOPRAZOLE SODIUM 40 MG PO TBEC
40.0000 mg | DELAYED_RELEASE_TABLET | Freq: Every day | ORAL | Status: DC
  Administered 2016-11-13 – 2016-11-16 (×4): 40 mg via ORAL
  Filled 2016-11-03 (×5): qty 1

## 2016-11-03 MED ORDER — HYDROMORPHONE HCL 1 MG/ML IJ SOLN
INTRAMUSCULAR | Status: AC
  Filled 2016-11-03: qty 1

## 2016-11-03 MED ORDER — SODIUM CHLORIDE 0.9 % IV SOLN
Freq: Once | INTRAVENOUS | Status: AC
  Administered 2016-11-03: 11:00:00 via INTRAVENOUS

## 2016-11-03 MED ORDER — HYDROMORPHONE HCL 1 MG/ML IJ SOLN
0.2500 mg | INTRAMUSCULAR | Status: DC | PRN
  Administered 2016-11-03 (×2): 0.5 mg via INTRAVENOUS

## 2016-11-03 MED ORDER — ONDANSETRON 4 MG PO TBDP: 4.0000 mg | ORAL_TABLET | Freq: Four times a day (QID) | ORAL | Status: DC | PRN

## 2016-11-03 MED ORDER — TRANEXAMIC ACID 1000 MG/10ML IV SOLN
1000.0000 mg | INTRAVENOUS | Status: AC
  Administered 2016-11-03: 1000 mg via INTRAVENOUS
  Filled 2016-11-03: qty 10

## 2016-11-03 MED ORDER — FENTANYL CITRATE (PF) 100 MCG/2ML IJ SOLN
INTRAMUSCULAR | Status: AC
  Administered 2016-11-03: 100 ug via INTRAVENOUS
  Filled 2016-11-03: qty 2

## 2016-11-03 MED ORDER — DEXAMETHASONE SODIUM PHOSPHATE 4 MG/ML IJ SOLN
INTRAMUSCULAR | Status: DC | PRN
  Administered 2016-11-03: 10 mg via INTRAVENOUS

## 2016-11-03 MED ORDER — ONDANSETRON HCL 4 MG/2ML IJ SOLN
4.0000 mg | Freq: Four times a day (QID) | INTRAMUSCULAR | Status: DC | PRN
  Administered 2016-11-06 – 2016-11-13 (×7): 4 mg via INTRAVENOUS
  Filled 2016-11-03 (×7): qty 2

## 2016-11-03 MED ORDER — FENTANYL CITRATE (PF) 100 MCG/2ML IJ SOLN
50.0000 ug | Freq: Once | INTRAMUSCULAR | Status: AC
  Administered 2016-11-03: 50 ug via INTRAVENOUS

## 2016-11-03 MED ORDER — FENTANYL CITRATE (PF) 100 MCG/2ML IJ SOLN
50.0000 ug | Freq: Once | INTRAMUSCULAR | Status: AC
  Administered 2016-11-03: 100 ug via INTRAVENOUS

## 2016-11-03 MED ORDER — ONDANSETRON HCL 4 MG/2ML IJ SOLN
INTRAMUSCULAR | Status: DC | PRN
  Administered 2016-11-03: 4 mg via INTRAVENOUS

## 2016-11-03 MED ORDER — MORPHINE SULFATE (PF) 4 MG/ML IV SOLN
1.0000 mg | INTRAVENOUS | Status: DC | PRN
  Administered 2016-11-03 – 2016-11-05 (×6): 3 mg via INTRAVENOUS
  Administered 2016-11-06: 2 mg via INTRAVENOUS
  Administered 2016-11-06 (×2): 3 mg via INTRAVENOUS
  Administered 2016-11-07 – 2016-11-08 (×7): 2 mg via INTRAVENOUS
  Administered 2016-11-08: 3 mg via INTRAVENOUS
  Administered 2016-11-08 – 2016-11-09 (×3): 2 mg via INTRAVENOUS
  Administered 2016-11-09 (×2): 3 mg via INTRAVENOUS
  Administered 2016-11-10 (×2): 2 mg via INTRAVENOUS
  Filled 2016-11-03 (×23): qty 1

## 2016-11-03 MED ORDER — HYDROMORPHONE HCL 1 MG/ML IJ SOLN
1.0000 mg | INTRAMUSCULAR | Status: DC | PRN
  Administered 2016-11-03 – 2016-11-09 (×21): 1 mg via INTRAVENOUS
  Filled 2016-11-03 (×22): qty 1

## 2016-11-03 MED ORDER — POTASSIUM CHLORIDE IN NACL 20-0.9 MEQ/L-% IV SOLN
INTRAVENOUS | Status: AC
  Administered 2016-11-03 – 2016-11-06 (×8): via INTRAVENOUS
  Filled 2016-11-03 (×15): qty 1000

## 2016-11-03 MED ORDER — EPHEDRINE SULFATE-NACL 50-0.9 MG/10ML-% IV SOSY
PREFILLED_SYRINGE | INTRAVENOUS | Status: DC | PRN
  Administered 2016-11-03: 10 mg via INTRAVENOUS

## 2016-11-03 MED ORDER — PHENYLEPHRINE HCL 10 MG/ML IJ SOLN
INTRAVENOUS | Status: DC | PRN
  Administered 2016-11-03: 40 ug/min via INTRAVENOUS

## 2016-11-03 MED ORDER — BUPIVACAINE HCL (PF) 0.25 % IJ SOLN
INTRAMUSCULAR | Status: DC | PRN
  Administered 2016-11-03: 30 mL

## 2016-11-03 MED ORDER — MORPHINE SULFATE (PF) 4 MG/ML IV SOLN
INTRAVENOUS | Status: AC
  Administered 2016-11-03: 3 mg via INTRAVENOUS
  Filled 2016-11-03: qty 1

## 2016-11-03 MED ORDER — BUPIVACAINE HCL (PF) 0.25 % IJ SOLN
INTRAMUSCULAR | Status: AC
  Filled 2016-11-03: qty 30

## 2016-11-03 MED ORDER — SODIUM CHLORIDE 0.9 % IV SOLN
INTRAVENOUS | Status: DC | PRN
  Administered 2016-11-03 (×3): via INTRAVENOUS

## 2016-11-03 MED ORDER — ROCURONIUM BROMIDE 100 MG/10ML IV SOLN
INTRAVENOUS | Status: DC | PRN
  Administered 2016-11-03: 50 mg via INTRAVENOUS
  Administered 2016-11-03: 30 mg via INTRAVENOUS
  Administered 2016-11-03 (×2): 20 mg via INTRAVENOUS

## 2016-11-03 MED ORDER — VANCOMYCIN HCL IN DEXTROSE 1-5 GM/200ML-% IV SOLN
1000.0000 mg | Freq: Once | INTRAVENOUS | Status: AC
  Administered 2016-11-03: 1000 mg via INTRAVENOUS
  Filled 2016-11-03 (×2): qty 200

## 2016-11-03 MED ORDER — MORPHINE SULFATE (PF) 4 MG/ML IV SOLN: 1.0000 mg | INTRAVENOUS | Status: DC | PRN

## 2016-11-03 SURGICAL SUPPLY — 113 items
BANDAGE ACE 3X5.8 VEL STRL LF (GAUZE/BANDAGES/DRESSINGS) ×10 IMPLANT
BANDAGE ACE 4X5 VEL STRL LF (GAUZE/BANDAGES/DRESSINGS) ×10 IMPLANT
BIT DRILL 2.0 LNG QUCK RELEASE (BIT) ×3 IMPLANT
BIT DRILL 2.8X5 QR DISP (BIT) ×5 IMPLANT
BIT DRILL 4.3MM 195MM QC (BIT) ×3 IMPLANT
BIT DRILL CANN 4.5MM (BIT) ×3 IMPLANT
BLADE CLIPPER SURG (BLADE) IMPLANT
BNDG ESMARK 4X9 LF (GAUZE/BANDAGES/DRESSINGS) ×10 IMPLANT
BNDG GAUZE ELAST 4 BULKY (GAUZE/BANDAGES/DRESSINGS) ×10 IMPLANT
BUCKET CAST 5QT PAPER WAX WHT (CAST SUPPLIES) ×5 IMPLANT
CHLORAPREP W/TINT 26ML (MISCELLANEOUS) ×5 IMPLANT
CLAMP LG COMBINATION (Clamp) ×10 IMPLANT
COMBO CLAMP ×10 IMPLANT
CORDS BIPOLAR (ELECTRODE) ×10 IMPLANT
COVER SURGICAL LIGHT HANDLE (MISCELLANEOUS) ×10 IMPLANT
CUFF TOURNIQUET SINGLE 18IN (TOURNIQUET CUFF) ×10 IMPLANT
CUFF TOURNIQUET SINGLE 24IN (TOURNIQUET CUFF) IMPLANT
DECANTER SPIKE VIAL GLASS SM (MISCELLANEOUS) IMPLANT
DERMABOND ADVANCED (GAUZE/BANDAGES/DRESSINGS) ×4
DERMABOND ADVANCED .7 DNX12 (GAUZE/BANDAGES/DRESSINGS) ×6 IMPLANT
DRAIN CHANNEL 15F RND FF W/TCR (WOUND CARE) IMPLANT
DRAIN TLS ROUND 10FR (DRAIN) IMPLANT
DRAPE C-ARM 42X72 X-RAY (DRAPES) ×5 IMPLANT
DRAPE C-ARMOR (DRAPES) ×5 IMPLANT
DRAPE INCISE IOBAN 66X45 STRL (DRAPES) ×10 IMPLANT
DRAPE OEC MINIVIEW 54X84 (DRAPES) IMPLANT
DRAPE PROXIMA HALF (DRAPES) ×10 IMPLANT
DRAPE SURG 17X23 STRL (DRAPES) ×35 IMPLANT
DRAPE U-SHAPE 47X51 STRL (DRAPES) ×5 IMPLANT
DRAPE UNIVERSAL PACK (DRAPES) ×5 IMPLANT
DRILL 2.0 LNG QUICK RELEASE (BIT) ×5
DRILL BIT 4.5 (BIT) ×2
DRILL BIT CANN 4.5MM (BIT) ×2
DRSG MEPILEX BORDER 4X4 (GAUZE/BANDAGES/DRESSINGS) ×10 IMPLANT
DRSG MEPILEX BORDER 4X8 (GAUZE/BANDAGES/DRESSINGS) ×10 IMPLANT
DRSG TEGADERM 4X4.75 (GAUZE/BANDAGES/DRESSINGS) ×5 IMPLANT
ELECT REM PT RETURN 9FT ADLT (ELECTROSURGICAL) ×5
ELECTRODE REM PT RTRN 9FT ADLT (ELECTROSURGICAL) ×3 IMPLANT
EVACUATOR SILICONE 100CC (DRAIN) IMPLANT
GAUZE SPONGE 4X4 12PLY STRL (GAUZE/BANDAGES/DRESSINGS) ×5 IMPLANT
GAUZE SPONGE 4X4 16PLY XRAY LF (GAUZE/BANDAGES/DRESSINGS) ×5 IMPLANT
GAUZE XEROFORM 1X8 LF (GAUZE/BANDAGES/DRESSINGS) ×10 IMPLANT
GLOVE BIO SURGEON STRL SZ7.5 (GLOVE) ×25 IMPLANT
GLOVE BIOGEL PI IND STRL 7.5 (GLOVE) ×3 IMPLANT
GLOVE BIOGEL PI IND STRL 8 (GLOVE) ×6 IMPLANT
GLOVE BIOGEL PI INDICATOR 7.5 (GLOVE) ×2
GLOVE BIOGEL PI INDICATOR 8 (GLOVE) ×4
GOWN STRL REUS W/ TWL LRG LVL3 (GOWN DISPOSABLE) ×15 IMPLANT
GOWN STRL REUS W/ TWL XL LVL3 (GOWN DISPOSABLE) ×18 IMPLANT
GOWN STRL REUS W/TWL LRG LVL3 (GOWN DISPOSABLE) ×10
GOWN STRL REUS W/TWL XL LVL3 (GOWN DISPOSABLE) ×12
GUIDEWIRE 2.0MM (WIRE) ×15 IMPLANT
GUIDEWIRE ORTHO 0.054X6 (WIRE) ×15 IMPLANT
GUIDEWIRE THREADED 2.8MM (WIRE) ×5 IMPLANT
K WIRE ×15 IMPLANT
K-WIRE THREADED .094X9 (WIRE) ×10 IMPLANT
KIT BASIN OR (CUSTOM PROCEDURE TRAY) ×10 IMPLANT
KIT ROOM TURNOVER OR (KITS) ×10 IMPLANT
LOOP VESSEL MAXI BLUE (MISCELLANEOUS) IMPLANT
MANIFOLD NEPTUNE II (INSTRUMENTS) ×10 IMPLANT
NEEDLE 22X1 1/2 (OR ONLY) (NEEDLE) IMPLANT
NS IRRIG 1000ML POUR BTL (IV SOLUTION) ×15 IMPLANT
PACK ORTHO EXTREMITY (CUSTOM PROCEDURE TRAY) ×10 IMPLANT
PACK TOTAL JOINT (CUSTOM PROCEDURE TRAY) ×5 IMPLANT
PAD ARMBOARD 7.5X6 YLW CONV (MISCELLANEOUS) ×20 IMPLANT
PAD CAST 4YDX4 CTTN HI CHSV (CAST SUPPLIES) ×6 IMPLANT
PADDING CAST COTTON 4X4 STRL (CAST SUPPLIES) ×4
PADDING CAST SYNTHETIC 4 (CAST SUPPLIES) ×2
PADDING CAST SYNTHETIC 4X4 STR (CAST SUPPLIES) ×3 IMPLANT
PIN STIMANN 6.0 (PIN) ×10 IMPLANT
PIN TO BAR CLAMP ×8 IMPLANT
PLATE PROX NARROW LEFT (Plate) ×5 IMPLANT
ROD CRBN FBR LRG EX-FX 11X200 (Rod) ×10 IMPLANT
SCREW 6.5MM CANN 32X80 (Screw) ×5 IMPLANT
SCREW CORT FT 18X2.3XLCK HEX (Screw) ×3 IMPLANT
SCREW CORTICAL LOCKING 2.3X18M (Screw) ×10 IMPLANT
SCREW FX18X2.3XSMTH LCK NS CRT (Screw) ×12 IMPLANT
SCREW HEXALOBE NON-LOCK 3.5X14 (Screw) ×5 IMPLANT
SCREW NON TOGG 2.3X20MM (Screw) ×5 IMPLANT
SCREW NONLOCK HEX 3.5X12 (Screw) ×10 IMPLANT
SPLINT PLASTER EXTRA FAST 3X15 (CAST SUPPLIES) ×2
SPLINT PLASTER GYPS XFAST 3X15 (CAST SUPPLIES) ×3 IMPLANT
SPONGE LAP 18X18 X RAY DECT (DISPOSABLE) IMPLANT
SPONGE LAP 4X18 X RAY DECT (DISPOSABLE) IMPLANT
STAPLER VISISTAT 35W (STAPLE) ×5 IMPLANT
SUCTION FRAZIER HANDLE 10FR (MISCELLANEOUS) ×2
SUCTION TUBE FRAZIER 10FR DISP (MISCELLANEOUS) ×3 IMPLANT
SUT ETHILON 3 0 PS 1 (SUTURE) ×5 IMPLANT
SUT ETHILON 4 0 PS 2 18 (SUTURE) ×10 IMPLANT
SUT MNCRL AB 3-0 PS2 18 (SUTURE) ×5 IMPLANT
SUT MNCRL AB 4-0 PS2 18 (SUTURE) ×10 IMPLANT
SUT MON AB 2-0 CT1 36 (SUTURE) ×5 IMPLANT
SUT PROLENE 3 0 PS 2 (SUTURE) IMPLANT
SUT VIC AB 0 CT1 27 (SUTURE) ×4
SUT VIC AB 0 CT1 27XBRD ANBCTR (SUTURE) ×6 IMPLANT
SUT VIC AB 1 CT1 18XCR BRD 8 (SUTURE) IMPLANT
SUT VIC AB 1 CT1 8-18 (SUTURE)
SUT VIC AB 2-0 CT1 27 (SUTURE) ×4
SUT VIC AB 2-0 CT1 TAPERPNT 27 (SUTURE) ×6 IMPLANT
SUT VIC AB 3-0 FS2 27 (SUTURE) IMPLANT
SUT VIC AB 4-0 P-3 18X BRD (SUTURE) ×3 IMPLANT
SUT VIC AB 4-0 P3 18 (SUTURE) ×2
SYR CONTROL 10ML LL (SYRINGE) IMPLANT
SYSTEM CHEST DRAIN TLS 7FR (DRAIN) IMPLANT
TOWEL OR 17X24 6PK STRL BLUE (TOWEL DISPOSABLE) ×10 IMPLANT
TOWEL OR 17X26 10 PK STRL BLUE (TOWEL DISPOSABLE) ×15 IMPLANT
TRAY FOLEY W/METER SILVER 16FR (SET/KITS/TRAYS/PACK) IMPLANT
TUBE CONNECTING 12'X1/4 (SUCTIONS) ×2
TUBE CONNECTING 12X1/4 (SUCTIONS) ×8 IMPLANT
TUBE EVACUATION TLS (MISCELLANEOUS) ×10 IMPLANT
UNDERPAD 30X30 (UNDERPADS AND DIAPERS) ×10 IMPLANT
WASHER ×8 IMPLANT
WATER STERILE IRR 1000ML POUR (IV SOLUTION) ×25 IMPLANT
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
## 2016-11-03 NOTE — Anesthesia Procedure Notes (Signed)
Procedure Name: Intubation Date/Time: 11/03/2016 3:38 PM Performed by: Marchelle Folks ANN Pre-anesthesia Checklist: Patient identified, Emergency Drugs available, Suction available and Patient being monitored Patient Re-evaluated:Patient Re-evaluated prior to induction Oxygen Delivery Method: Circle System Utilized Preoxygenation: Pre-oxygenation with 100% oxygen Induction Type: IV induction Ventilation: Mask ventilation without difficulty Laryngoscope Size: Mac and 3 Grade View: Grade I Tube type: Oral Tube size: 8.0 mm Number of attempts: 1 Airway Equipment and Method: Stylet and Oral airway Placement Confirmation: ETT inserted through vocal cords under direct vision,  positive ETCO2 and breath sounds checked- equal and bilateral Secured at: 23 cm Tube secured with: Tape Dental Injury: Teeth and Oropharynx as per pre-operative assessment
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
## 2016-11-03 NOTE — Anesthesia Preprocedure Evaluation (Signed)
Anesthesia Evaluation  Patient identified by MRN, date of birth, ID band Patient awake    Reviewed: Allergy & Precautions, H&P , NPO status , Patient's Chart, lab work & pertinent test results  Airway Mallampati: II   Neck ROM: full    Dental   Pulmonary neg pulmonary ROS,    breath sounds clear to auscultation       Cardiovascular negative cardio ROS   Rhythm:regular Rate:Normal     Neuro/Psych    GI/Hepatic   Endo/Other    Renal/GU      Musculoskeletal   Abdominal   Peds  Hematology   Anesthesia Other Findings   Reproductive/Obstetrics                             Anesthesia Physical Anesthesia Plan  ASA: I  Anesthesia Plan: General   Post-op Pain Management:    Induction: Intravenous  PONV Risk Score and Plan: 2 and Ondansetron, Dexamethasone, Midazolam and Treatment may vary due to age or medical condition  Airway Management Planned: Oral ETT  Additional Equipment:   Intra-op Plan:   Post-operative Plan: Extubation in OR  Informed Consent: I have reviewed the patients History and Physical, chart, labs and discussed the procedure including the risks, benefits and alternatives for the proposed anesthesia with the patient or authorized representative who has indicated his/her understanding and acceptance.       Plan Discussed with: CRNA, Anesthesiologist and Surgeon  Anesthesia Plan Comments:         Anesthesia Quick Evaluation  

## 2016-11-03 NOTE — Progress Notes (Signed)
Report called to [facility] in surgical short stay. Transport will be up shortly to take patient down for surgery.

## 2016-11-03 NOTE — Progress Notes (Signed)
Pt with no void since admission, pt with strong urge to void, uncomfortable, unable to bladder scan with pelvic binder in place. MD paged and order placed for foley.

## 2016-11-03 NOTE — Brief Op Note (Signed)
11/02/2016 - 11/03/2016  5:32 PM  PATIENT:  Lucia Bitter  31 y.o. male  PRE-OPERATIVE DIAGNOSIS:  Left distal radius fracture, left thumb metacarpal fracture  POST-OPERATIVE DIAGNOSIS:  Left distal radius fracture, left thumb metacarpal fracture  PROCEDURE:  Procedure(s): OPEN REDUCTION INTERNAL FIXATION (ORIF) DISTAL RADIAL FRACTURE (Left) Closed reduction pin fixation left thumb metacarpal fracture SURGEON:  Surgeon(s) and Role: Panel 1:    * Haddix, Gillie Manners, MD - Primary  Panel 2:    * Betha Loa, MD - Primary  PHYSICIAN ASSISTANT:   ASSISTANTS: none   ANESTHESIA:   general  EBL:  Total I/O In: 1405 [I.V.:1405] Out: 510 [Urine:510]  BLOOD ADMINISTERED:none  DRAINS: none   LOCAL MEDICATIONS USED:  MARCAINE     SPECIMEN:  No Specimen  DISPOSITION OF SPECIMEN:  N/A  COUNTS:  YES  TOURNIQUET:   Total Tourniquet Time Documented: Upper Arm (Left) - 85 minutes Total: Upper Arm (Left) - 85 minutes   DICTATION: .Note written in EPIC  PLAN OF CARE: Admit to inpatient   PATIENT DISPOSITION:  Remained in OR for further procedure   Delay start of Pharmacological VTE agent (>24hrs) due to surgical blood loss or risk of bleeding: no
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
## 2016-11-03 NOTE — Progress Notes (Signed)
Subjective: Day of Surgery Procedure(s) (LRB): OPEN REDUCTION INTERNAL FIXATION (ORIF) PELVIC FRACTURE (N/A) OPEN REDUCTION INTERNAL FIXATION (ORIF) DISTAL RADIAL FRACTURE (Left) One day s/p closed reduction left distal radius fracture.    Objective: Vital signs in last 24 hours: Temp:  [97.9 F (36.6 C)-98.1 F (36.7 C)] 97.9 F (36.6 C) (10/14 0800) Pulse Rate:  [77-117] 112 (10/14 1100) Resp:  [16-48] 29 (10/14 1100) BP: (81-143)/(49-83) 120/66 (10/14 1100) SpO2:  [95 %-100 %] 97 % (10/14 1100) Weight:  [68 kg (150 lb)] 68 kg (150 lb) (10/13 2326)  Intake/Output from previous day: 10/13 0701 - 10/14 0700 In: 505 [I.V.:505] Out: 0  Intake/Output this shift: Total I/O In: 405 [I.V.:405] Out: 200 [Urine:200]   Recent Labs  11/02/16 2227 11/02/16 2229 11/03/16 0331  HGB 12.0* 12.2* 11.0*    Recent Labs  11/02/16 2227 11/02/16 2229 11/03/16 0331  WBC 14.2*  --  12.3*  RBC 4.50  --  4.19*  HCT 37.0* 36.0* 34.8*  PLT 221  --  229    Recent Labs  11/02/16 2227 11/02/16 2229 11/03/16 0331  NA 135 141 138  K 3.4* 3.5 3.6  CL 109 107 109  CO2 19*  --  19*  BUN CREATININE 0.99 1.10 1.18  GLUCOSE 131* 129* 137*  CALCIUM 7.5*  --  7.7*    Recent Labs  11/02/16 2227 11/03/16 0331  INR 1.11 1.25    Splint in good repair.  Moving digits.  Assessment/Plan: Day of Surgery Procedure(s) (LRB): OPEN REDUCTION INTERNAL FIXATION (ORIF) PELVIC FRACTURE (N/A) OPEN REDUCTION INTERNAL FIXATION (ORIF) DISTAL RADIAL FRACTURE (Left) Plan for ORIF left distal radius and ORIF vs CRPP left thumb metacarpal fracture.  Risks, benefits and alternatives of surgery were discussed including risks of blood loss, infection, damage to nerves/vessels/tendons/ligament/bone, failure of surgery, need for additional surgery, complication with wound healing, nonunion, malunion, stiffness.  He voiced understanding of these risks and elected to proceed.    Evan Landry  R 11/03/2016, 12:23 PM

## 2016-11-03 NOTE — Transfer of Care (Signed)
Immediate Anesthesia Transfer of Care Note  Patient: Evan Landry  Procedure(s) Performed: OPEN REDUCTION INTERNAL FIXATION (ORIF) PELVIC FRACTURE (N/A Pelvis) OPEN REDUCTION INTERNAL FIXATION (ORIF) DISTAL RADIAL FRACTURE, CLOSED REDUCTION LEFT THUMB METACARPAL FRACTURE , LEFT BRACHIORADIALIS RELEASE . (Left )  Patient Location: PACU  Anesthesia Type:General  Level of Consciousness: sedated, drowsy, patient cooperative and responds to stimulation  Airway & Oxygen Therapy: Patient Spontanous Breathing and Patient connected to nasal cannula oxygen  Post-op Assessment: Report given to RN, Post -op Vital signs reviewed and stable and Patient moving all extremities X 4  Post vital signs: Reviewed and stable  Last Vitals:  Vitals:   11/03/16 1100 11/03/16 1936  BP: 120/66   Pulse: (!) 112   Resp: (!) 29   Temp:  (!) (P) 36.2 C  SpO2: 97%     Last Pain:  Vitals:   11/03/16 1936  TempSrc:   PainSc: (P) 3       Patients Stated Pain Goal: 0 (11/03/16 0800)  Complications: No apparent anesthesia complications

## 2016-11-04 ENCOUNTER — Inpatient Hospital Stay (HOSPITAL_COMMUNITY): Payer: BLUE CROSS/BLUE SHIELD

## 2016-11-04 LAB — TYPE AND SCREEN
ABO/RH(D): A POS
Antibody Screen: NEGATIVE
Unit division: 0
Unit division: 0

## 2016-11-04 LAB — BPAM RBC
Blood Product Expiration Date: 201811062359
Blood Product Expiration Date: 201811062359
Unit Type and Rh: 6200
Unit Type and Rh: 6200

## 2016-11-04 MED ORDER — METOPROLOL TARTRATE 5 MG/5ML IV SOLN
5.0000 mg | Freq: Four times a day (QID) | INTRAVENOUS | Status: DC
Start: 2016-11-04 — End: 2016-11-14
  Administered 2016-11-04 – 2016-11-06 (×8): 5 mg via INTRAVENOUS
  Administered 2016-11-06: 2.5 mg via INTRAVENOUS
  Administered 2016-11-06: 5 mg via INTRAVENOUS
  Administered 2016-11-06: 2.5 mg via INTRAVENOUS
  Administered 2016-11-06 – 2016-11-14 (×31): 5 mg via INTRAVENOUS
  Filled 2016-11-04 (×41): qty 5

## 2016-11-04 MED ORDER — DOCUSATE SODIUM 100 MG PO CAPS
100.0000 mg | ORAL_CAPSULE | Freq: Two times a day (BID) | ORAL | Status: DC
  Administered 2016-11-04 – 2016-11-11 (×6): 100 mg via ORAL
  Filled 2016-11-04 (×8): qty 1

## 2016-11-04 MED ORDER — TRAMADOL HCL 50 MG PO TABS
50.0000 mg | ORAL_TABLET | Freq: Four times a day (QID) | ORAL | Status: DC
  Administered 2016-11-04 – 2016-11-05 (×3): 50 mg via ORAL
  Filled 2016-11-04 (×4): qty 1

## 2016-11-04 MED ORDER — OXYCODONE HCL 5 MG PO TABS
5.0000 mg | ORAL_TABLET | ORAL | Status: DC | PRN
  Administered 2016-11-04 (×2): 10 mg via ORAL
  Filled 2016-11-04 (×2): qty 2

## 2016-11-04 NOTE — Progress Notes (Addendum)
Right wrist is swollen but not very painful or tender.  Slightly tender in the snuffbox.  Will get some portable X-rays.  Abdomen is distended, hypoactive bowel sounds.  Very uncomfortable for the patient.  He has an ileus.  Will make NPO except for ice chips, possible will place NGT.  They have not been able to get his labs drawn.  May need a central line or PICC.    Marta Lamas. Gae Bon, MD, FACS [phone] Trauma Surgeon  Addendum:  Has second metacarpal fracture and an ileus.  Will not place NGT unless the patient starts vomiting.  NPO except ice chips and meds.  Will Contact Dr. Jena Gauss about hand fracture.  Marta Lamas. Gae Bon, MD, FACS [phone] Trauma Surgeon

## 2016-11-04 NOTE — Plan of Care (Signed)
Problem: Tissue Perfusion: Goal: Risk factors for ineffective tissue perfusion will decrease Outcome: Progressing SCDs on
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
## 2016-11-04 NOTE — Progress Notes (Signed)
Lab unable to get labs, even in foot. MD notified. Will attempt to redraw on night shift per phelebotomy, so time changed on labs to 1730.

## 2016-11-04 NOTE — Progress Notes (Signed)
Patient complaining of increased pain in his abdomen. His right hand is also swollen and tender. Dr. Lindie Spruce notified. Will continue to monitor. Ripley Fraise D
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
## 2016-11-05 ENCOUNTER — Telehealth (HOSPITAL_COMMUNITY): Payer: Self-pay

## 2016-11-05 ENCOUNTER — Other Ambulatory Visit: Payer: Self-pay | Admitting: Orthopedic Surgery

## 2016-11-05 LAB — BASIC METABOLIC PANEL
ANION GAP: 4 — AB (ref 5–15)
BUN: 8 mg/dL (ref 6–20)
CHLORIDE: 109 mmol/L (ref 101–111)
CO2: 25 mmol/L (ref 22–32)
Calcium: 7.8 mg/dL — ABNORMAL LOW (ref 8.9–10.3)
Creatinine, Ser: 0.67 mg/dL (ref 0.61–1.24)
GFR calc non Af Amer: >60 mL/min (ref >60)
Glucose, Bld: 115 mg/dL — ABNORMAL HIGH (ref 65–99)
Potassium: 4 mmol/L (ref 3.5–5.1)
Sodium: 138 mmol/L (ref 135–145)

## 2016-11-05 LAB — CBC
HCT: 24.8 % — ABNORMAL LOW (ref 39.0–52.0)
HEMOGLOBIN: 7.9 g/dL — AB (ref 13.0–17.0)
MCH: 26.2 pg (ref 26.0–34.0)
MCHC: 31.9 g/dL (ref 30.0–36.0)
MCV: 82.4 fL (ref 78.0–100.0)
Platelets: 148 10*3/uL — ABNORMAL LOW (ref 150–400)
RBC: 3.01 MIL/uL — AB (ref 4.22–5.81)
RDW: 13.4 % (ref 11.5–15.5)
WBC: 7.5 10*3/uL (ref 4.0–10.5)

## 2016-11-05 MED ORDER — PHENOL 1.4 % MT LIQD
1.0000 | OROMUCOSAL | Status: DC | PRN
  Administered 2016-11-05 – 2016-11-06 (×2): 1 via OROMUCOSAL
  Filled 2016-11-05: qty 177

## 2016-11-05 MED ORDER — SODIUM CHLORIDE 0.9% FLUSH
10.0000 mL | INTRAVENOUS | Status: DC | PRN
  Administered 2016-11-08 – 2016-11-11 (×2): 10 mL
  Filled 2016-11-05 (×2): qty 40

## 2016-11-05 MED ORDER — SODIUM CHLORIDE 0.9% FLUSH
10.0000 mL | Freq: Two times a day (BID) | INTRAVENOUS | Status: DC
  Administered 2016-11-05 – 2016-11-06 (×3): 10 mL
  Administered 2016-11-06: 20 mL
  Administered 2016-11-07 – 2016-11-15 (×13): 10 mL

## 2016-11-05 MED ORDER — CHLORHEXIDINE GLUCONATE CLOTH 2 % EX PADS
6.0000 | MEDICATED_PAD | Freq: Every day | CUTANEOUS | Status: DC
  Administered 2016-11-05 – 2016-11-15 (×11): 6 via TOPICAL

## 2016-11-05 MED ORDER — ACETAMINOPHEN 160 MG/5ML PO SOLN: 650.0000 mg | Freq: Four times a day (QID) | ORAL | Status: DC | PRN
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
## 2016-11-05 NOTE — Telephone Encounter (Signed)
Spoke with Larita Fife from [employer]. States Dr. Mina Marble is planning to operate on metacarpal FX and wanted to confirm patient stable for surgery this week. Initially wanted to take patient to OR tomorrow however Dr. Jena Gauss planning definitive pelvic fixation in OR tomorrow. Per nurse, Dr. Mina Marble with touch base with Dr. Jena Gauss for further surgical plans.

## 2016-11-05 NOTE — Plan of Care (Signed)
Problem: Pain Managment: Goal: General experience of comfort will improve Outcome: Progressing Hourly pain checks. Staying with IV meds today as pt has decreased gut motility.
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
## 2016-11-05 NOTE — Anesthesia Postprocedure Evaluation (Signed)
Anesthesia Post Note  Patient: Evan Landry  Procedure(s) Performed: OPEN REDUCTION INTERNAL FIXATION (ORIF) PELVIC FRACTURE (N/A Pelvis) OPEN REDUCTION INTERNAL FIXATION (ORIF) DISTAL RADIAL FRACTURE, CLOSED REDUCTION LEFT THUMB METACARPAL FRACTURE , LEFT BRACHIORADIALIS RELEASE . (Left )     Patient location during evaluation: PACU Anesthesia Type: General Level of consciousness: awake and alert Pain management: pain level controlled Vital Signs Assessment: post-procedure vital signs reviewed and stable Respiratory status: spontaneous breathing, nonlabored ventilation, respiratory function stable and patient connected to nasal cannula oxygen Cardiovascular status: blood pressure returned to baseline and stable Postop Assessment: no apparent nausea or vomiting Anesthetic complications: no    Last Vitals:  Vitals:   11/05/16 0700 11/05/16 0800  BP: 131/85 120/68  Pulse: 100 (!) 103  Resp: (!) 40 (!) 25  Temp:  37.1 C  SpO2: 100% 100%    Last Pain:  Vitals:   11/05/16 0815  TempSrc:   PainSc: Asleep                 Linh Johannes S

## 2016-11-05 NOTE — Progress Notes (Signed)
Pt and mother educated about tomorrow's Rt metacarpal surgery by Dr Mina Marble.

## 2016-11-05 NOTE — Progress Notes (Signed)
Peripherally Inserted Central Catheter/Midline Placement  The IV Nurse has discussed with the patient and/or persons authorized to consent for the patient, the purpose of this procedure and the potential benefits and risks involved with this procedure.  The benefits include less needle sticks, lab draws from the catheter, and the patient may be discharged home with the catheter. Risks include, but not limited to, infection, bleeding, blood clot (thrombus formation), and puncture of an artery; nerve damage and irregular heartbeat and possibility to perform a PICC exchange if needed/ordered by physician.  Alternatives to this procedure were also discussed.  Bard Power PICC patient education guide, fact sheet on infection prevention and patient information card has been provided to patient /or left at bedside.    PICC/Midline Placement Documentation    Conssent signed by wife at bedside    Maximino Greenland 11/05/2016, 10:46 AM
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
## 2016-11-05 NOTE — Social Work (Signed)
CSW completed SBIRT with patient. No intervention needed at this time as patient had no risk factors.  Keene Breath, LCSW Clinical Social Worker [phone]

## 2016-11-05 NOTE — Progress Notes (Signed)
Patient seen and examined at bedside this evening. Radiographs reveal a fracture of the index metacarpal base on his dominant right side. I discussed with the patient and his family the need for operative fixation. We will attempt to do this after his pelvic fixation tomorrow afternoon. We discussed in great detail the nature of his injury and the operative procedure necessary to restore anatomy. We will proceed tomorrow as previously stated. Patient will follow up in our office for both his left distal radius and his right index metacarpal fracture

## 2016-11-05 NOTE — Telephone Encounter (Signed)
[phone]  Please call [facility].  Needs to speak to TS in regards to [employer] [phone]).

## 2016-11-05 NOTE — Plan of Care (Signed)
Problem: Education: Goal: Knowledge of [facility] Education information/materials will improve Outcome: Progressing Patient and wife updated on plan of care by RN Dr Janee Morn and Dr Jena Gauss. Family and patient educated about tomorrows ORIF pelvis by Dr. Jena Gauss. Ongoing medication and pulmonary toilet education given by RN. Family educated about PICC line by RN and MD.

## 2016-11-06 ENCOUNTER — Encounter (HOSPITAL_COMMUNITY): Payer: Self-pay | Admitting: Registered Nurse

## 2016-11-06 ENCOUNTER — Inpatient Hospital Stay (HOSPITAL_COMMUNITY): Payer: BLUE CROSS/BLUE SHIELD

## 2016-11-06 ENCOUNTER — Inpatient Hospital Stay (HOSPITAL_COMMUNITY): Payer: BLUE CROSS/BLUE SHIELD | Admitting: Anesthesiology

## 2016-11-06 ENCOUNTER — Encounter (HOSPITAL_COMMUNITY): Admission: EM | Disposition: A | Discharge status: Rehabilitation Facility | Payer: Self-pay | Source: Home / Self Care

## 2016-11-06 HISTORY — PX: OPEN REDUCTION INTERNAL FIXATION (ORIF) METACARPAL: SHX6234

## 2016-11-06 HISTORY — PX: ORIF PELVIC FRACTURE: SHX2128

## 2016-11-06 LAB — BASIC METABOLIC PANEL
Anion gap: 6 (ref 5–15)
BUN: 7 mg/dL (ref 6–20)
CALCIUM: 7.7 mg/dL — AB (ref 8.9–10.3)
CO2: 24 mmol/L (ref 22–32)
CREATININE: 0.69 mg/dL (ref 0.61–1.24)
Chloride: 109 mmol/L (ref 101–111)
GFR calc non Af Amer: >60 mL/min (ref >60)
Glucose, Bld: 95 mg/dL (ref 65–99)
Potassium: 3.9 mmol/L (ref 3.5–5.1)
Sodium: 139 mmol/L (ref 135–145)

## 2016-11-06 LAB — CBC
HEMATOCRIT: 23.7 % — AB (ref 39.0–52.0)
Hemoglobin: 7.5 g/dL — ABNORMAL LOW (ref 13.0–17.0)
MCH: 26.3 pg (ref 26.0–34.0)
MCHC: 31.6 g/dL (ref 30.0–36.0)
MCV: 83.2 fL (ref 78.0–100.0)
Platelets: 172 10*3/uL (ref 150–400)
RBC: 2.85 MIL/uL — ABNORMAL LOW (ref 4.22–5.81)
RDW: 12.8 % (ref 11.5–15.5)
WBC: 7.1 10*3/uL (ref 4.0–10.5)

## 2016-11-06 LAB — SURGICAL PCR SCREEN
MRSA, PCR: NEGATIVE
STAPHYLOCOCCUS AUREUS: NEGATIVE

## 2016-11-06 LAB — PREPARE RBC (CROSSMATCH)

## 2016-11-06 SURGERY — OPEN REDUCTION INTERNAL FIXATION (ORIF) PELVIC FRACTURE
Anesthesia: General | Site: Pelvis | Laterality: Right

## 2016-11-06 MED ORDER — BUPIVACAINE HCL (PF) 0.25 % IJ SOLN
INTRAMUSCULAR | Status: DC | PRN
  Administered 2016-11-06: 10 mL

## 2016-11-06 MED ORDER — BUPIVACAINE HCL (PF) 0.25 % IJ SOLN
INTRAMUSCULAR | Status: AC
  Filled 2016-11-06: qty 30

## 2016-11-06 MED ORDER — LIDOCAINE 2% (20 MG/ML) 5 ML SYRINGE
INTRAMUSCULAR | Status: DC | PRN
  Administered 2016-11-06: 80 mg via INTRAVENOUS

## 2016-11-06 MED ORDER — SODIUM CHLORIDE 0.9 % IV SOLN
INTRAVENOUS | Status: DC | PRN
  Administered 2016-11-06 (×2): via INTRAVENOUS

## 2016-11-06 MED ORDER — CEFAZOLIN SODIUM-DEXTROSE 2-3 GM-%(50ML) IV SOLR
INTRAVENOUS | Status: DC | PRN
  Administered 2016-11-06: 2 g via INTRAVENOUS

## 2016-11-06 MED ORDER — TOBRAMYCIN SULFATE 1.2 G IJ SOLR
INTRAMUSCULAR | Status: AC
  Filled 2016-11-06: qty 1.2

## 2016-11-06 MED ORDER — SODIUM CHLORIDE 0.9 % IV SOLN: Freq: Once | INTRAVENOUS | Status: AC

## 2016-11-06 MED ORDER — LIDOCAINE 2% (20 MG/ML) 5 ML SYRINGE
INTRAMUSCULAR | Status: AC
  Filled 2016-11-06: qty 5

## 2016-11-06 MED ORDER — 0.9 % SODIUM CHLORIDE (POUR BTL) OPTIME
TOPICAL | Status: DC | PRN
  Administered 2016-11-06 (×2): 1000 mL

## 2016-11-06 MED ORDER — DEXAMETHASONE SODIUM PHOSPHATE 10 MG/ML IJ SOLN
INTRAMUSCULAR | Status: AC
  Filled 2016-11-06: qty 1

## 2016-11-06 MED ORDER — ROCURONIUM BROMIDE 10 MG/ML (PF) SYRINGE
PREFILLED_SYRINGE | INTRAVENOUS | Status: DC | PRN
  Administered 2016-11-06 (×2): 20 mg via INTRAVENOUS
  Administered 2016-11-06: 70 mg via INTRAVENOUS
  Administered 2016-11-06: 20 mg via INTRAVENOUS
  Administered 2016-11-06: 30 mg via INTRAVENOUS

## 2016-11-06 MED ORDER — ROCURONIUM BROMIDE 50 MG/5ML IV SOLN
INTRAVENOUS | Status: AC
  Filled 2016-11-06: qty 1

## 2016-11-06 MED ORDER — FENTANYL CITRATE (PF) 250 MCG/5ML IJ SOLN
INTRAMUSCULAR | Status: AC
  Filled 2016-11-06: qty 5

## 2016-11-06 MED ORDER — ONDANSETRON HCL 4 MG/2ML IJ SOLN
INTRAMUSCULAR | Status: AC
  Filled 2016-11-06: qty 2

## 2016-11-06 MED ORDER — ALBUTEROL SULFATE (2.5 MG/3ML) 0.083% IN NEBU
INHALATION_SOLUTION | RESPIRATORY_TRACT | Status: AC
  Filled 2016-11-06: qty 3

## 2016-11-06 MED ORDER — CHLORHEXIDINE GLUCONATE 4 % EX LIQD: 60.0000 mL | Freq: Once | CUTANEOUS | Status: DC

## 2016-11-06 MED ORDER — TOBRAMYCIN SULFATE 1.2 G IJ SOLR
INTRAMUSCULAR | Status: DC | PRN
  Administered 2016-11-06: 1.2 g via TOPICAL

## 2016-11-06 MED ORDER — ROCURONIUM BROMIDE 50 MG/5ML IV SOLN
INTRAVENOUS | Status: AC
  Filled 2016-11-06: qty 2

## 2016-11-06 MED ORDER — PROPOFOL 10 MG/ML IV BOLUS
INTRAVENOUS | Status: DC | PRN
  Administered 2016-11-06: 200 mg via INTRAVENOUS

## 2016-11-06 MED ORDER — VANCOMYCIN HCL 1000 MG IV SOLR
INTRAVENOUS | Status: DC | PRN
  Administered 2016-11-06: 1000 mg

## 2016-11-06 MED ORDER — CEFAZOLIN SODIUM 1 G IJ SOLR
INTRAMUSCULAR | Status: AC
  Filled 2016-11-06: qty 20

## 2016-11-06 MED ORDER — FENTANYL CITRATE (PF) 250 MCG/5ML IJ SOLN
INTRAMUSCULAR | Status: DC | PRN
  Administered 2016-11-06 (×5): 50 ug via INTRAVENOUS
  Administered 2016-11-06: 150 ug via INTRAVENOUS
  Administered 2016-11-06: 50 ug via INTRAVENOUS
  Administered 2016-11-06: 100 ug via INTRAVENOUS
  Administered 2016-11-06: 50 ug via INTRAVENOUS

## 2016-11-06 MED ORDER — TRANEXAMIC ACID 1000 MG/10ML IV SOLN
2000.0000 mg | Freq: Once | INTRAVENOUS | Status: AC
  Filled 2016-11-06: qty 20

## 2016-11-06 MED ORDER — CEFAZOLIN SODIUM-DEXTROSE 1-4 GM/50ML-% IV SOLN
INTRAVENOUS | Status: DC | PRN
  Administered 2016-11-06: 2 g via INTRAVENOUS

## 2016-11-06 MED ORDER — FENTANYL CITRATE (PF) 100 MCG/2ML IJ SOLN: 25.0000 ug | INTRAMUSCULAR | Status: DC | PRN

## 2016-11-06 MED ORDER — LACTATED RINGERS IV SOLN
INTRAVENOUS | Status: DC
  Administered 2016-11-06 – 2016-11-14 (×4): via INTRAVENOUS

## 2016-11-06 MED ORDER — SUGAMMADEX SODIUM 200 MG/2ML IV SOLN
INTRAVENOUS | Status: DC | PRN
  Administered 2016-11-06: 150 mg via INTRAVENOUS

## 2016-11-06 MED ORDER — MIDAZOLAM HCL 2 MG/2ML IJ SOLN
INTRAMUSCULAR | Status: AC
  Filled 2016-11-06: qty 2

## 2016-11-06 MED ORDER — VANCOMYCIN HCL 1000 MG IV SOLR
INTRAVENOUS | Status: AC
  Filled 2016-11-06: qty 2000

## 2016-11-06 MED ORDER — CEFAZOLIN SODIUM-DEXTROSE 2-4 GM/100ML-% IV SOLN
2.0000 g | Freq: Three times a day (TID) | INTRAVENOUS | Status: AC
  Administered 2016-11-06 – 2016-11-07 (×3): 2 g via INTRAVENOUS
  Filled 2016-11-06 (×3): qty 100

## 2016-11-06 MED ORDER — TRANEXAMIC ACID 1000 MG/10ML IV SOLN
1000.0000 mg | INTRAVENOUS | Status: AC
  Administered 2016-11-06: 1000 mg via INTRAVENOUS
  Filled 2016-11-06: qty 10

## 2016-11-06 MED ORDER — DEXAMETHASONE SODIUM PHOSPHATE 10 MG/ML IJ SOLN
INTRAMUSCULAR | Status: DC | PRN
  Administered 2016-11-06: 10 mg via INTRAVENOUS

## 2016-11-06 MED ORDER — ALBUTEROL SULFATE (2.5 MG/3ML) 0.083% IN NEBU
2.5000 mg | INHALATION_SOLUTION | Freq: Four times a day (QID) | RESPIRATORY_TRACT | Status: DC | PRN
  Administered 2016-11-06: 2.5 mg via RESPIRATORY_TRACT

## 2016-11-06 MED ORDER — CEFAZOLIN SODIUM-DEXTROSE 2-4 GM/100ML-% IV SOLN
INTRAVENOUS | Status: AC
  Filled 2016-11-06: qty 100

## 2016-11-06 MED ORDER — MIDAZOLAM HCL 5 MG/5ML IJ SOLN
INTRAMUSCULAR | Status: DC | PRN
  Administered 2016-11-06: 2 mg via INTRAVENOUS

## 2016-11-06 MED ORDER — PROMETHAZINE HCL 25 MG/ML IJ SOLN: 6.2500 mg | INTRAMUSCULAR | Status: DC | PRN

## 2016-11-06 SURGICAL SUPPLY — 96 items
BANDAGE ACE 4X5 VEL STRL LF (GAUZE/BANDAGES/DRESSINGS) ×4 IMPLANT
BIT DRILL 1.1 MINI (BIT) ×2 IMPLANT
BIT DRILL 2.5X300 (BIT) ×2 IMPLANT
BIT DRILL CALIBRATED 3.2MM (DRILL) ×2 IMPLANT
BIT DRILL CANN 4.5MM (BIT) ×4 IMPLANT
BIT DRILL CANN LRG QC 5X300 (BIT) ×4 IMPLANT
BIT DRILL FLUTED 2.5 (BIT) ×4 IMPLANT
BLADE CLIPPER SURG (BLADE) IMPLANT
BNDG GAUZE ELAST 4 BULKY (GAUZE/BANDAGES/DRESSINGS) ×4 IMPLANT
CHLORAPREP W/TINT 26ML (MISCELLANEOUS) ×4 IMPLANT
CORD BIPOLAR FORCEPS 12FT (ELECTRODE) ×4 IMPLANT
DERMABOND ADVANCED (GAUZE/BANDAGES/DRESSINGS) ×2
DERMABOND ADVANCED .7 DNX12 (GAUZE/BANDAGES/DRESSINGS) ×2 IMPLANT
DRAIN CHANNEL 15F RND FF W/TCR (WOUND CARE) IMPLANT
DRAPE C-ARM 42X72 X-RAY (DRAPES) ×4 IMPLANT
DRAPE C-ARMOR (DRAPES) ×4 IMPLANT
DRAPE INCISE IOBAN 66X45 STRL (DRAPES) ×8 IMPLANT
DRAPE PROXIMA HALF (DRAPES) ×8 IMPLANT
DRAPE SURG 17X23 STRL (DRAPES) ×24 IMPLANT
DRAPE U-SHAPE 47X51 STRL (DRAPES) ×4 IMPLANT
DRAPE UNIVERSAL PACK (DRAPES) ×8 IMPLANT
DRILL BIT 1.1 MINI (BIT) ×2
DRILL BIT 2.5X300 (BIT) ×2
DRILL BIT CANN 4.5MM (BIT) ×4
DRILL BIT FLUTED 2.5 (BIT) ×8
DRILL CALIBRATED 3.2MM (DRILL) ×4
DRSG MEPILEX BORDER 4X4 (GAUZE/BANDAGES/DRESSINGS) ×4 IMPLANT
DRSG MEPILEX BORDER 4X8 (GAUZE/BANDAGES/DRESSINGS) ×4 IMPLANT
ELECT REM PT RETURN 9FT ADLT (ELECTROSURGICAL) ×4
ELECTRODE REM PT RTRN 9FT ADLT (ELECTROSURGICAL) ×2 IMPLANT
EVACUATOR SILICONE 100CC (DRAIN) IMPLANT
GAUZE SPONGE 4X4 12PLY STRL (GAUZE/BANDAGES/DRESSINGS) ×4 IMPLANT
GAUZE XEROFORM 1X8 LF (GAUZE/BANDAGES/DRESSINGS) ×4 IMPLANT
GLOVE BIO SURGEON STRL SZ7.5 (GLOVE) ×16 IMPLANT
GLOVE BIOGEL PI IND STRL 7.5 (GLOVE) ×2 IMPLANT
GLOVE BIOGEL PI INDICATOR 7.5 (GLOVE) ×2
GOWN STRL REUS W/ TWL LRG LVL3 (GOWN DISPOSABLE) ×4 IMPLANT
GOWN STRL REUS W/TWL LRG LVL3 (GOWN DISPOSABLE) ×4
GUIDEWIRE 2.0MM (WIRE) ×12 IMPLANT
GUIDEWIRE THREADED 2.8 (WIRE) ×4 IMPLANT
GUIDEWIRE THREADED 2.8MM (WIRE) ×12 IMPLANT
KIT BASIN OR (CUSTOM PROCEDURE TRAY) ×8 IMPLANT
KIT ROOM TURNOVER OR (KITS) ×4 IMPLANT
MANIFOLD NEPTUNE II (INSTRUMENTS) ×4 IMPLANT
NEEDLE HYPO 25GX1X1/2 BEV (NEEDLE) ×4 IMPLANT
NS IRRIG 1000ML POUR BTL (IV SOLUTION) ×8 IMPLANT
PACK ORTHO EXTREMITY (CUSTOM PROCEDURE TRAY) ×4 IMPLANT
PACK TOTAL JOINT (CUSTOM PROCEDURE TRAY) ×4 IMPLANT
PAD ARMBOARD 7.5X6 YLW CONV (MISCELLANEOUS) ×8 IMPLANT
PAD CAST 4YDX4 CTTN HI CHSV (CAST SUPPLIES) ×2 IMPLANT
PADDING CAST COTTON 4X4 STRL (CAST SUPPLIES) ×2
PLATE PUBLIC SYMPHOSIS 3.5 (Plate) ×4 IMPLANT
SCREW 1.5X14MM (Screw) ×4 IMPLANT
SCREW 1.5X8MM (Screw) ×4 IMPLANT
SCREW CANN 6.5X105 32MM THRD (Screw) ×4 IMPLANT
SCREW CANN 7.3X140MM (Screw) ×4 IMPLANT
SCREW CANN 7.3X85MM FULL THRED (Screw) ×2 IMPLANT
SCREW CORTEX 3.5 26MM (Screw) ×4 IMPLANT
SCREW CORTEX 3.5 30MM (Screw) ×4 IMPLANT
SCREW CORTEX 3.5 32MM (Screw) ×2 IMPLANT
SCREW CORTEX 3.5 34MM (Screw) ×4 IMPLANT
SCREW CORTEX 3.5 38MM (Screw) ×2 IMPLANT
SCREW CORTEX 3.5 50MM (Screw) ×4 IMPLANT
SCREW CORTEX 3.5 55MM (Screw) ×4 IMPLANT
SCREW LOCK CORT ST 3.5X26 (Screw) ×4 IMPLANT
SCREW LOCK CORT ST 3.5X30 (Screw) ×4 IMPLANT
SCREW LOCK CORT ST 3.5X32 (Screw) ×2 IMPLANT
SCREW LOCK CORT ST 3.5X34 (Screw) ×4 IMPLANT
SCREW LOCK CORT ST 3.5X38 (Screw) ×2 IMPLANT
SCREWCANN 7.3X85MM FULL THREAD (Screw) ×4 IMPLANT
SPLINT PLASTER CAST XFAST 4X15 (CAST SUPPLIES) ×2 IMPLANT
SPLINT PLASTER XTRA FAST SET 4 (CAST SUPPLIES) ×2
SPONGE LAP 18X18 X RAY DECT (DISPOSABLE) IMPLANT
STAPLER VISISTAT 35W (STAPLE) ×4 IMPLANT
SUCTION FRAZIER HANDLE 10FR (MISCELLANEOUS) ×2
SUCTION TUBE FRAZIER 10FR DISP (MISCELLANEOUS) ×2 IMPLANT
SUT ETHILON 3 0 PS 1 (SUTURE) ×4 IMPLANT
SUT ETHILON 4 0 PS 2 18 (SUTURE) ×4 IMPLANT
SUT MNCRL AB 3-0 PS2 18 (SUTURE) ×4 IMPLANT
SUT MON AB 2-0 CT1 36 (SUTURE) ×4 IMPLANT
SUT MON AB 3-0 SH 27 (SUTURE) ×2
SUT MON AB 3-0 SH27 (SUTURE) ×2 IMPLANT
SUT VIC AB 0 CT1 27 (SUTURE) ×4
SUT VIC AB 0 CT1 27XBRD ANBCTR (SUTURE) ×4 IMPLANT
SUT VIC AB 0 CT2 27 (SUTURE) ×4 IMPLANT
SUT VIC AB 1 CT1 18XCR BRD 8 (SUTURE) IMPLANT
SUT VIC AB 1 CT1 8-18 (SUTURE)
SUT VIC AB 2-0 CT1 27 (SUTURE) ×4
SUT VIC AB 2-0 CT1 TAPERPNT 27 (SUTURE) ×4 IMPLANT
SUT VIC AB 4-0 PS2 18 (SUTURE) ×4 IMPLANT
SYR CONTROL 10ML LL (SYRINGE) ×4 IMPLANT
TOWEL OR 17X24 6PK STRL BLUE (TOWEL DISPOSABLE) ×4 IMPLANT
TOWEL OR 17X26 10 PK STRL BLUE (TOWEL DISPOSABLE) ×8 IMPLANT
TRAY FOLEY W/METER SILVER 16FR (SET/KITS/TRAYS/PACK) IMPLANT
WASHER FOR 5.0 SCREWS (Washer) ×8 IMPLANT
WATER STERILE IRR 1000ML POUR (IV SOLUTION) ×16 IMPLANT
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
## 2016-11-06 NOTE — Anesthesia Procedure Notes (Signed)
Central Venous Catheter Insertion Performed by: Beryle LatheBROCK, Keimari E, anesthesiologist Start/End10/17/2018 12:17 PM, 11/06/2016 12:36 PM Patient location: OR. Preanesthetic checklist: patient identified, IV checked, risks and benefits discussed, surgical consent, monitors and equipment checked, pre-op evaluation, timeout performed and anesthesia consent Position: supine Hand hygiene performed , maximum sterile barriers used  and Seldinger technique used Catheter size: 8 Fr Total catheter length 16. Central line was placed.Double lumen Procedure performed using ultrasound guided technique. Ultrasound Notes:anatomy identified, needle tip was noted to be adjacent to the nerve/plexus identified, no ultrasound evidence of intravascular and/or intraneural injection and image(s) printed for medical record Attempts: 2 (First attempt on left IJ, unable to pass guide wire to adequate depth. Second attempt on right IJ, success) Following insertion, dressing applied, line sutured and Biopatch. Post procedure assessment: blood return through all ports  Patient tolerated the procedure well with no immediate complications.

## 2016-11-06 NOTE — Anesthesia Procedure Notes (Signed)
Procedure Name: Intubation Date/Time: 11/06/2016 11:30 AM Performed by: Talbot Grumbling Pre-anesthesia Checklist: Patient identified, Emergency Drugs available, Suction available and Patient being monitored Patient Re-evaluated:Patient Re-evaluated prior to induction Oxygen Delivery Method: Circle system utilized Preoxygenation: Pre-oxygenation with 100% oxygen Induction Type: IV induction Ventilation: Mask ventilation without difficulty Laryngoscope Size: Mac and 3 Grade View: Grade I Tube type: Oral Tube size: 8.0 mm Number of attempts: 1 (Student attempt to visualize without success, Saturation dropping. CRNA intubated with ease.) Airway Equipment and Method: Stylet Placement Confirmation: ETT inserted through vocal cords under direct vision,  positive ETCO2 and breath sounds checked- equal and bilateral Secured at: 23 cm Tube secured with: Tape (Benzo to help secure tape.) Dental Injury: Teeth and Oropharynx as per pre-operative assessment

## 2016-11-06 NOTE — Progress Notes (Signed)
Patient arrived to unit and placed on monitor. Heart rate 131. BP 139/73.  Brock at bedside and ordered for prn metoprolol to be given. Orders received and carried out.

## 2016-11-06 NOTE — Progress Notes (Signed)
Called Dr. Mal AmabileBrock due to tachypnea, tachycardia, low oxygen saturation. Dr. Mal AmabileBrock to bedside and order for chest xray received. No other orders received. Patient began wheezing and Dr. Mal AmabileBrock made aware. Order for albuterol nebulizer received and administered. Patient's condition improving. Will continue to monitor and patient to be transported to 4N.

## 2016-11-06 NOTE — Progress Notes (Signed)
I have been given report from PACU and the pt has just arrived back on the unit, a little sleepy, but breathing well with no complaints of pain as of yet.  Will continue to monitor.

## 2016-11-06 NOTE — Anesthesia Postprocedure Evaluation (Signed)
Anesthesia Post Note  Patient: Evan Landry  Procedure(s) Performed: OPEN REDUCTION INTERNAL FIXATION (ORIF) PELVIC FRACTURE (Bilateral Pelvis) OPEN REDUCTION INTERNAL FIXATION (ORIF) METACARPAL (Right Hand)     Patient location during evaluation: PACU Anesthesia Type: General Level of consciousness: awake and patient cooperative Pain management: pain level controlled Vital Signs Assessment: post-procedure vital signs reviewed and stable Respiratory status: spontaneous breathing, nonlabored ventilation and patient connected to face mask oxygen Cardiovascular status: blood pressure returned to baseline and stable Postop Assessment: no apparent nausea or vomiting Anesthetic complications: no Comments: Patient remains drowsy but follows commands. CXR in PACU shows basilar atelectasis but otherwise clear lung fields. Albuterol treatment given in PACU. Weaning face mask O2 as able. Of note, patient on nasal cannula prior to procedure this morning, desatted to mid 80's on room air after transport to OR from preop holding.    Last Vitals:  Vitals:   11/06/16 1953 11/06/16 2005  BP: (!) 155/115 (!) 142/90  Pulse: (!) 112 (!) 115  Resp: (!) 30 (!) 35  Temp:    SpO2: 100% 93%    Last Pain:  Vitals:   11/06/16 1908  TempSrc:   PainSc: 0-No pain                 Evan Landry

## 2016-11-06 NOTE — Transfer of Care (Signed)
Immediate Anesthesia Transfer of Care Note  Patient: Evan Landry  Procedure(s) Performed: OPEN REDUCTION INTERNAL FIXATION (ORIF) PELVIC FRACTURE (Bilateral Pelvis) OPEN REDUCTION INTERNAL FIXATION (ORIF) METACARPAL (Right Hand)  Patient Location: PACU  Anesthesia Type:General  Level of Consciousness: sedated, drowsy and patient cooperative  Airway & Oxygen Therapy: Patient Spontanous Breathing and Patient connected to face mask oxygen  Post-op Assessment: Report given to RN and Post -op Vital signs reviewed and stable  Post vital signs: Reviewed  Last Vitals: 151/90, 90, 18, 100% Vitals:   11/06/16 1000 11/06/16 1838  BP: (!) 165/78 (!) 153/103  Pulse: (!) 129 (!) 108  Resp: (!) 40 (!) 30  Temp:  36.7 C  SpO2: 97% 93%    Last Pain:  Vitals:   11/06/16 1838  TempSrc:   PainSc: (P) Asleep      Patients Stated Pain Goal: 4 (11/04/16 1400)  Complications: No apparent anesthesia complications
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
## 2016-11-06 NOTE — Progress Notes (Signed)
Patient transported off unit to Short Stay by Short Stay RN. Ok for patient to travel to short stay unmonitored per Dr. Janee Mornhompson. Report called to Darl PikesSusan, RN in Short Stay. Nursing to continue to monitor.
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
## 2016-11-07 ENCOUNTER — Inpatient Hospital Stay (HOSPITAL_COMMUNITY): Payer: BLUE CROSS/BLUE SHIELD

## 2016-11-07 ENCOUNTER — Encounter (HOSPITAL_COMMUNITY): Payer: Self-pay | Admitting: Student

## 2016-11-07 LAB — BPAM RBC
BLOOD PRODUCT EXPIRATION DATE: 201811082359
BLOOD PRODUCT EXPIRATION DATE: 201811082359
BLOOD PRODUCT EXPIRATION DATE: 201811082359
Blood Product Expiration Date: 201811082359
ISSUE DATE / TIME: 201810171034
ISSUE DATE / TIME: 201810171034
ISSUE DATE / TIME: 201810171034
ISSUE DATE / TIME: 201810171034
UNIT TYPE AND RH: 6200
UNIT TYPE AND RH: 6200
UNIT TYPE AND RH: 6200
Unit Type and Rh: 6200

## 2016-11-07 LAB — TYPE AND SCREEN
ABO/RH(D): A POS
Antibody Screen: NEGATIVE
UNIT DIVISION: 0
UNIT DIVISION: 0
Unit division: 0
Unit division: 0

## 2016-11-07 LAB — POCT I-STAT 4, (NA,K, GLUC, HGB,HCT)
GLUCOSE: 131 mg/dL — AB (ref 65–99)
Glucose, Bld: 125 mg/dL — ABNORMAL HIGH (ref 65–99)
HCT: 27 % — ABNORMAL LOW (ref 39.0–52.0)
HEMATOCRIT: 22 % — AB (ref 39.0–52.0)
HEMOGLOBIN: 9.2 g/dL — AB (ref 13.0–17.0)
Hemoglobin: 7.5 g/dL — ABNORMAL LOW (ref 13.0–17.0)
POTASSIUM: 4.3 mmol/L (ref 3.5–5.1)
Potassium: 4.1 mmol/L (ref 3.5–5.1)
SODIUM: 142 mmol/L (ref 135–145)
SODIUM: 143 mmol/L (ref 135–145)

## 2016-11-07 LAB — GLUCOSE, CAPILLARY: Glucose-Capillary: 134 mg/dL — ABNORMAL HIGH (ref 65–99)

## 2016-11-07 LAB — CBC
HCT: 34.8 % — ABNORMAL LOW (ref 39.0–52.0)
HEMOGLOBIN: 11.8 g/dL — AB (ref 13.0–17.0)
MCH: 28.1 pg (ref 26.0–34.0)
MCHC: 33.9 g/dL (ref 30.0–36.0)
MCV: 82.9 fL (ref 78.0–100.0)
PLATELETS: 161 10*3/uL (ref 150–400)
RBC: 4.2 MIL/uL — ABNORMAL LOW (ref 4.22–5.81)
RDW: 14 % (ref 11.5–15.5)
WBC: 11 10*3/uL — ABNORMAL HIGH (ref 4.0–10.5)

## 2016-11-07 MED ORDER — INSULIN ASPART 100 UNIT/ML ~~LOC~~ SOLN
0.0000 [IU] | Freq: Four times a day (QID) | SUBCUTANEOUS | Status: DC
  Administered 2016-11-07: 1 [IU] via SUBCUTANEOUS
  Administered 2016-11-08: 2 [IU] via SUBCUTANEOUS
  Administered 2016-11-08 (×2): 1 [IU] via SUBCUTANEOUS
  Administered 2016-11-08: 2 [IU] via SUBCUTANEOUS
  Administered 2016-11-09 (×2): 1 [IU] via SUBCUTANEOUS
  Administered 2016-11-09 (×2): 2 [IU] via SUBCUTANEOUS
  Administered 2016-11-10 – 2016-11-11 (×4): 1 [IU] via SUBCUTANEOUS
  Administered 2016-11-11: 2 [IU] via SUBCUTANEOUS
  Administered 2016-11-11 – 2016-11-13 (×8): 1 [IU] via SUBCUTANEOUS

## 2016-11-07 MED ORDER — SODIUM CHLORIDE 0.9 % IV SOLN
INTRAVENOUS | Status: AC
  Administered 2016-11-07: 18:00:00 via INTRAVENOUS

## 2016-11-07 MED ORDER — TRACE MINERALS CR-CU-MN-SE-ZN 10-1000-500-60 MCG/ML IV SOLN
INTRAVENOUS | Status: AC
  Administered 2016-11-07: 18:00:00 via INTRAVENOUS
  Filled 2016-11-07: qty 840
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
## 2016-11-07 NOTE — Progress Notes (Signed)
Patient underwent ORIF right index metacarpal yesterday  Will need to be seen in our office next week if possible for right and left dressing changes and splints.  If still inpatient can be done at bedside

## 2016-11-07 NOTE — Progress Notes (Signed)
[facility] Progress Note  1 Day Post-Op  Subjective: CC:  Pain controlled. Still w/ abdominal distention - NGT not suctioning appropriately overnight. Denies N/V. Denies numbness/tingling in extremities.   Afebrile, VSS - tachycardia improved today. Objective: Vital signs in last 24 hours: Temp:  [98.1 F (36.7 C)-99.1 F (37.3 C)] 98.1 F (36.7 C) (10/18 0800) Pulse Rate:  [79-129] 79 (10/18 0900) Resp:  [20-40] 27 (10/18 0900) BP: (125-166)/(75-115) 135/95 (10/18 0900) SpO2:  [90 %-100 %] 96 % (10/18 0900) Weight:  [68 kg (150 lb)] 68 kg (150 lb) (10/17 1025) Last BM Date: 11/02/16  Intake/Output from previous day: 10/17 0701 - 10/18 0700 In: 6095 [I.V.:5140; Blood:630; IV Piggyback:200] Out: 1790 [Urine:1440; Emesis/NG output:100; Blood:250] Intake/Output this shift: Total I/O In: 215 [I.V.:215] Out: -   PE: General appearance: alert and cooperative HEENT: pupils equal and round, EOM's in tact, anicteric sclerae  Resp: clear to auscultation bilaterally, normal respiratory effort Cardio: regular rate and rhythm, no m/r/g, pedal pulses 2+ GI: distended, NT, hypoactive BS Extremities: BUE splinted, wiggles fingers, WWP; BLE wiggles toes, toes WWP, sensation in tact bilaterally.   Lab Results:    Recent Labs  11/06/16 0537 11/07/16 0416  WBC 7.1 11.0*  HGB 7.5* 11.8*  HCT 23.7* 34.8*  PLT 172 161   BMET  Recent Labs  11/05/16 1142 11/06/16 0537  NA 138 139  K 4.0 3.9  CL 109 109  CO2 25 24  GLUCOSE 115* 95  BUN 8 7  CREATININE 0.67 0.69  CALCIUM 7.8* 7.7*   PT/INR No results for input(s): LABPROT, INR in the last 72 hours. CMP     Component Value Date/Time   NA 139 11/06/2016 0537   K 3.9 11/06/2016 0537   CL 109 11/06/2016 0537   CO2 24 11/06/2016 0537   GLUCOSE 95 11/06/2016 0537   BUN 7 11/06/2016 0537   CREATININE 0.69 11/06/2016 0537   CALCIUM 7.7 (L) 11/06/2016 0537   PROT 6.5 11/02/2016 2227   ALBUMIN 3.3 (L)  11/02/2016 2227   AST 48 (H) 11/02/2016 2227   ALT 39 11/02/2016 2227   ALKPHOS 58 11/02/2016 2227   BILITOT 0.5 11/02/2016 2227   GFRNONAA >60 11/06/2016 0537   GFRAA >60 11/06/2016 0537   Lipase  No results found for: LIPASE     Studies/Results: Dg Pelvis Comp Min 3v  Result Date: 11/06/2016 CLINICAL DATA:  Postoperative imaging of pelvic fractures. EXAM: JUDET PELVIS - 3+ VIEW COMPARISON:  Intraoperative fluoroscopy 11/06/2016 FINDINGS: Postoperative changes with screw fixation of the left SI joint an additional screw fixation across both SI joints. Screw fixation of the left acetabulum and innominate bone. Plate and screw fixation of the symphysis pubis. Ten tracts demonstrated in the iliac wings bilaterally. There has been re- approximation of the SI joints and symphysis pubis. Persistent nondisplaced or minimally displaced fractures also demonstrated in the right acetabulum and both inferior pubic rami. IMPRESSION: Postoperative changes consistent with internal fixation of multiple pelvic fractures. SI joints and symphysis pubis are nondisplaced. Nondisplaced or minimally displaced fractures also demonstrated in the right acetabulum and bilateral inferior pubic rami. Electronically Signed   By: Burman NievesWilliam  Stevens M.D.   On: 11/06/2016 21:58   Dg Pelvis Comp Min 3v  Result Date: 11/06/2016 CLINICAL DATA:  Pelvic fracture ORIF. EXAM: DG C-ARM GT 120 MIN; JUDET PELVIS - 3+ VIEW FLUOROSCOPY TIME:  Fluoroscopy Time:  5 minutes and 12 seconds. Number of Acquired Spot Images: 28 COMPARISON:  CT abdomen  and pelvis November 03, 2016 FINDINGS: Sacroiliac screws. LEFT acetabular screw. Pubic symphysis plate and screw fixation. Additional pelvic fractures better characterized on prior CT. IMPRESSION: Multiple pelvic fracture ORIF. Electronically Signed   By: Awilda Metro M.D.   On: 11/06/2016 16:50   Dg Chest Port 1 View  Result Date: 11/06/2016 CLINICAL DATA:  Shortness of Breath EXAM:  PORTABLE CHEST 1 VIEW COMPARISON:  11/02/2016 FINDINGS: Cardiac shadow is mildly prominent but stable. Nasogastric catheter is noted within the stomach. Right-sided PICC line is noted at the cavoatrial junction. Right jugular central line is noted in the mid superior vena cava. No pneumothorax is seen. The overall inspiratory effort is poor with right basilar atelectasis. Scattered distended loops of small bowel are again identified and stable. IMPRESSION: Tubes and lines as described. Stable small bowel dilatation which may be related to ileus. Electronically Signed   By: Alcide Clever M.D.   On: 11/06/2016 19:44   Dg C-arm Gt 120 Min  Result Date: 11/06/2016 CLINICAL DATA:  Pelvic fracture ORIF. EXAM: DG C-ARM GT 120 MIN; JUDET PELVIS - 3+ VIEW FLUOROSCOPY TIME:  Fluoroscopy Time:  5 minutes and 12 seconds. Number of Acquired Spot Images: 28 COMPARISON:  CT abdomen and pelvis November 03, 2016 FINDINGS: Sacroiliac screws. LEFT acetabular screw. Pubic symphysis plate and screw fixation. Additional pelvic fractures better characterized on prior CT. IMPRESSION: Multiple pelvic fracture ORIF. Electronically Signed   By: Awilda Metro M.D.   On: 11/06/2016 16:50    Anti-infectives: Anti-infectives    Start     Dose/Rate Route Frequency Ordered Stop   11/07/16 0000  ceFAZolin (ANCEF) IVPB 2g/100 mL premix     2 g 200 mL/hr over 30 Minutes Intravenous Every 8 hours 11/06/16 2140 11/07/16 2159   11/06/16 1603  vancomycin (VANCOCIN) powder  Status:  Discontinued       As needed 11/06/16 1603 11/06/16 1834   11/06/16 1601  tobramycin (NEBCIN) powder  Status:  Discontinued       As needed 11/06/16 1602 11/06/16 1834   11/06/16 1106  ceFAZolin (ANCEF) 2-4 GM/100ML-% IVPB    Comments:  [name], Beth   : cabinet override      11/06/16 1106 11/06/16 2314   11/03/16 1600  vancomycin (VANCOCIN) IVPB 1000 mg/200 mL premix     1,000 mg 200 mL/hr over 60 Minutes Intravenous  Once 11/03/16 1546 11/03/16 1612        Assessment/Plan [facility] APC 3 pelvic ring FX - S/P SI screw, ex fix by Dr. Jena Gauss 10/14, S/P ORIF 10/18 Dr. Jena Gauss; BLE NWB.  L radius FX - S/P ORIF by Dr. Merlyn Lot 10/14 R 2nd MC FX - ORIF by Dr. Mina Marble 10/17,  Acute urinary retention - large scrotal hematoma from pelvic FX, contiue foley for now Grade 1 liver lac Ileus - NGT, lytes OK, start TNA today. ABL anemia - 2 u PRBC 10/18; hgb 11.8/hct 34.8 today  CV - lopressor has helped HR and BP but still elevated at times due to pain; improving  VTE - SCD's, hgb/hct stable. Resume lovenox when ok with ortho   Dispo - Therapies, ICU, may be able to go to SDU (will confirm with MD)    LOS: 4 days    Adam Phenix , [facility] 11/07/2016, 9:54 AM Pager: [phone] Consults: [phone] Mon-Fri 7:00 am-4:30 pm Sat-Sun 7:00 am-11:30 am

## 2016-11-07 NOTE — Evaluation (Signed)
Physical Therapy Evaluation Patient Details Name: Evan Landry MRN: [id number] DOB: [birth year] Today's Date: 11/07/2016   History of Present Illness  31 y.o. male admitted on 11/02/16 s/p [name] with resultant pelvic ring fx s/p SI screw, ex fix 11/03/16, ORIF 11/07/16 (NWB bil LEs), L radius fx s/p ORIF 11/03/16, R 2nd MC fx s.p ORIF 11/06/16, large scrotal hematoma from pelvic fx with acute urinary retention, ileum, ABL anemia (s/p 2 units of PRBCs 11/07/16).  Pt with no significant PMH.   Clinical Impression  Pt did much better than expected on his first day of mobilizing.  He was able to tolerate bil LE exercises and sat EOB and performed LE exercises there.  I think he would be ready to be lifted with maxi move or maxi sky OOB to chair tomorrow.  He did well using hooked arm/biceps and shoulders to assist in bed mobility and some trunk support in sitting (keepign in mind not to WB down through bil hands).  He would benefit from CIR level therapy at discharge.   PT to follow acutely for deficits listed below.     Follow Up Recommendations CIR    Equipment Recommendations  Wheelchair (measurements PT);Wheelchair cushion (measurements PT);3in1 (PT);Other (comment);Hospital bed (drop arm 3-in-1)    Recommendations for Other Services Rehab consult     Precautions / Restrictions Precautions Precautions: Fall Restrictions Weight Bearing Restrictions: Yes RUE Weight Bearing: Non weight bearing (can WB through elbows) LUE Weight Bearing: Non weight bearing (can WB through elbows) RLE Weight Bearing: Non weight bearing LLE Weight Bearing: Non weight bearing      Mobility  Bed Mobility Overal bed mobility: Needs Assistance Bed Mobility: Supine to Sit;Sit to Supine     Supine to sit: Max assist;HOB elevated Sit to supine: Max assist;HOB elevated   General bed mobility comments: Used bed to elevate as high as we could go then he helped me as much as he could progress bil legs to EOB  and I assisted in turning his pelvis with the bed pad and supporting him from the back to sit EOB.    Transfers                 General transfer comment: Not attempted today, but would likely be appropriate for lift OOB to chair with total lift (maxi sky or maxi move) tomorrow.                          Balance Overall balance assessment: Needs assistance Sitting-balance support: Feet supported;No upper extremity supported Sitting balance-Leahy Scale: Poor Sitting balance - Comments: posterior lean requiring max assist to support trunk in sitting EOB.  At times he could lighten his lean by hooking his right elbow into the bed rail using his biceps to stabilize.  Verbal cues not to WB down thorugh the hand in sitting.  Postural control: Posterior lean                                   Pertinent Vitals/Pain Pain Assessment: Faces Faces Pain Scale: Hurts whole lot Pain Location: primarily pelvis Pain Descriptors / Indicators: Grimacing;Guarding Pain Intervention(s): Limited activity within patient's tolerance;Monitored during session;Repositioned    Home Living Family/patient expects to be discharged to:: Private residence Living Arrangements: Spouse/significant other;Children;Other (Comment) (multiple childrenwith significant range in ages) Available Help at Discharge: Family;Available PRN/intermittently Type of Home: Mobile home Home Access:  Ramped entrance     Home Layout: One level Home Equipment: None      Prior Function Level of Independence: Independent         Comments: works as a Marketing executivecement truck driver full time        Extremity/Trunk Assessment   Upper Extremity Assessment Upper Extremity Assessment: Defer to OT evaluation    Lower Extremity Assessment Lower Extremity Assessment: RLE deficits/detail;LLE deficits/detail RLE Deficits / Details: bil legs externally rotated and abducted in bed likely a combination of pelvic fx and  scrotal edema.  Ankle grossly 3/5, knee with weak quad and hamstring 2/5 at least, hip 2-/5 LLE Deficits / Details: left leg grossly similar to right leg with the exception that his left lower leg is more painful than his right and he reports it is "broken".  I cannot find where any x-rays have been done to the left lower leg or any notes to this affect.     Cervical / Trunk Assessment Cervical / Trunk Assessment: Normal  Communication   Communication: No difficulties  Cognition Arousal/Alertness: Awake/alert Behavior During Therapy: WFL for tasks assessed/performed Overall Cognitive Status: Within Functional Limits for tasks assessed                                        General Comments General comments (skin integrity, edema, etc.): Pt desated on RA during EOB mobility to 85%, so 2 L O2 Benjamin re-applied to nose.     Exercises General Exercises - Lower Extremity Ankle Circles/Pumps: AROM;Both;20 reps Quad Sets: AROM;Both;10 reps Long Arc Quad: AROM;Both;10 reps Heel Slides: AAROM;Both;10 reps Hip ABduction/ADduction: AAROM;Both;10 reps   Assessment/Plan    PT Assessment Patient needs continued PT services  PT Problem List Decreased strength;Decreased activity tolerance;Decreased range of motion;Decreased balance;Decreased mobility;Decreased knowledge of use of DME;Decreased knowledge of precautions;Pain       PT Treatment Interventions DME instruction;Functional mobility training;Therapeutic activities;Therapeutic exercise;Balance training;Neuromuscular re-education;Patient/family education;Wheelchair mobility training;Manual techniques;Modalities    PT Goals (Current goals can be found in the Care Plan section)  Acute Rehab PT Goals Patient Stated Goal: to get better and be more independent again PT Goal Formulation: With patient/family Time For Goal Achievement: 11/21/16 Potential to Achieve Goals: Good    Frequency Min 5X/week           AM-PAC PT "6  Clicks" Daily Activity  Outcome Measure Difficulty turning over in bed (including adjusting bedclothes, sheets and blankets)?: Unable Difficulty moving from lying on back to sitting on the side of the bed? : Unable Difficulty sitting down on and standing up from a chair with arms (e.g., wheelchair, bedside commode, etc,.)?: Unable Help needed moving to and from a bed to chair (including a wheelchair)?: Total Help needed walking in hospital room?: Total Help needed climbing 3-5 steps with a railing? : Total 6 Click Score: 6    End of Session Equipment Utilized During Treatment: Oxygen Activity Tolerance: Patient limited by pain Patient left: in bed;with call bell/phone within reach;with family/visitor present Nurse Communication: Mobility status PT Visit Diagnosis: Muscle weakness (generalized) (M62.81);Difficulty in walking, not elsewhere classified (R26.2);Pain Pain - Right/Left:  (bil ) Pain - part of body:  (arms and pelvis)    Time: [id number] PT Time Calculation (min) (ACUTE ONLY): 39 min   Charges:         Lurena Joinerebecca B. Aiko Belko, PT, DPT [phone]     PT  Treatments $Therapeutic Activity: 23-37 mins   11/07/2016, 11:19 PM

## 2016-11-07 NOTE — Op Note (Signed)
NAME:  Evan Landry, Evan Landry              ACCOUNT NO.:  [id number]  MEDICAL RECORD NO.:  [id number]  LOCATION:  [facility]                         FACILITY:  [facility]  PHYSICIAN:  Artist PaisMatthew A. Stormie Ventola, M.D.DATE OF BIRTH:  09/18/1985  DATE OF PROCEDURE:  11/06/2016 DATE OF DISCHARGE:                              OPERATIVE REPORT   PREOPERATIVE DIAGNOSIS:  Displaced fracture, right index metacarpal.  POSTOPERATIVE DIAGNOSIS:  Displaced fracture, right index metacarpal.  PROCEDURE:  Open reduction and internal fixation, displaced right index finger metacarpal fracture.  SURGEON:  Artist PaisMatthew A. Mina MarbleWeingold, MD.  ASSISTANT:  None.  ANESTHESIA:  General.  COMPLICATIONS:  No complication.  DRAINS:  No drains.  The patient had been undergoing previous surgery by Dr. Caryn BeeKevin Haddix for pelvic injury.  At the end of this procedure, he was then re-prepped and draped in the usual sterile fashion on the right side.  An Esmarch was used to exsanguinate the limb.  Tourniquet was then inflated to 250 mmHg.  At this point in time, an incision was made over the index finger shaft and proximal base area on the right side.  Skin was incised sharply.  Dissection was carried down just radial to the extensor tendons, which were carefully retracted ulnarly.  A subperiosteal dissection of the fracture site was undertaken.  The fracture was debrided of clot and then held reduced with a reduction clamp.  It was fixed with a 1.5 x 14 mm modular handset screw from dorsal to volar along the radial side of the fracture line followed by second 1.5 x 8 mm transverse screw from ulnar to radial.  Fluoroscopy then revealed adequate reduction with AP, lateral, oblique views.  We took a 2-0 Vicryl and then passed 3 cerclage sutures around the fracture site for additional support.  Intraoperative fluoroscopy then revealed adequate reduction in AP, lateral, oblique views.  The wound was irrigated and loosely closed in layers with 2-0  undyed Vicryl and 4-0 nylon on skin. Xeroform, 4 x 4's, fluffs, and a volar splint were applied.  The patient tolerated this procedure well, went to the recovery room in stable fashion.    Artist PaisMatthew A. Mina MarbleWeingold, M.D.    MAW/MEDQ  D:  11/07/2016  T:  11/07/2016  Job:  [id number]

## 2016-11-08 ENCOUNTER — Encounter (HOSPITAL_COMMUNITY): Admission: EM | Disposition: A | Discharge status: Rehabilitation Facility | Payer: Self-pay | Source: Home / Self Care

## 2016-11-08 DIAGNOSIS — S32810A Multiple fractures of pelvis with stable disruption of pelvic ring, initial encounter for closed fracture: Secondary | ICD-10-CM

## 2016-11-08 LAB — COMPREHENSIVE METABOLIC PANEL
ALBUMIN: 2.2 g/dL — AB (ref 3.5–5.0)
ALK PHOS: 50 U/L (ref 38–126)
ALT: 52 U/L (ref 17–63)
AST: 117 U/L — AB (ref 15–41)
Anion gap: 7 (ref 5–15)
BILIRUBIN TOTAL: 0.9 mg/dL (ref 0.3–1.2)
BUN: 10 mg/dL (ref 6–20)
CALCIUM: 7.7 mg/dL — AB (ref 8.9–10.3)
CO2: 26 mmol/L (ref 22–32)
Chloride: 106 mmol/L (ref 101–111)
Creatinine, Ser: 0.55 mg/dL — ABNORMAL LOW (ref 0.61–1.24)
GFR calc Af Amer: >60 mL/min (ref >60)
GFR calc non Af Amer: >60 mL/min (ref >60)
GLUCOSE: 142 mg/dL — AB (ref 65–99)
POTASSIUM: 3.6 mmol/L (ref 3.5–5.1)
Sodium: 139 mmol/L (ref 135–145)
TOTAL PROTEIN: 5.8 g/dL — AB (ref 6.5–8.1)

## 2016-11-08 LAB — POCT I-STAT 3, ART BLOOD GAS (G3+)
ACID-BASE DEFICIT: 2 mmol/L (ref 0.0–2.0)
BICARBONATE: 21.7 mmol/L (ref 20.0–28.0)
O2 Saturation: 100 %
PCO2 ART: 27.5 mmHg — AB (ref 32.0–48.0)
PO2 ART: 160 mmHg — AB (ref 83.0–108.0)
Patient temperature: 32.2
TCO2: 23 mmol/L (ref 22–32)
pH, Arterial: 7.485 — ABNORMAL HIGH (ref 7.350–7.450)

## 2016-11-08 LAB — GLUCOSE, CAPILLARY
GLUCOSE-CAPILLARY: 139 mg/dL — AB (ref 65–99)
Glucose-Capillary: 153 mg/dL — ABNORMAL HIGH (ref 65–99)
Glucose-Capillary: 152 mg/dL — ABNORMAL HIGH (ref 65–99)
Glucose-Capillary: 135 mg/dL — ABNORMAL HIGH (ref 65–99)

## 2016-11-08 LAB — DIFFERENTIAL
BASOS PCT: 0 %
BLASTS: 0 %
Band Neutrophils: 6 %
Basophils Absolute: 0 10*3/uL (ref 0.0–0.1)
EOS PCT: 0 %
Eosinophils Absolute: 0 10*3/uL (ref 0.0–0.7)
Lymphocytes Relative: 19 %
Lymphs Abs: 1.6 10*3/uL (ref 0.7–4.0)
MONO ABS: 0.7 10*3/uL (ref 0.1–1.0)
MYELOCYTES: 0 %
Metamyelocytes Relative: 2 %
Monocytes Relative: 8 %
NRBC: 0 /100{WBCs}
Neutro Abs: 6.2 10*3/uL (ref 1.7–7.7)
Neutrophils Relative %: 65 %
OTHER: 0 %
Promyelocytes Absolute: 0 %

## 2016-11-08 LAB — CBC
HEMATOCRIT: 35.8 % — AB (ref 39.0–52.0)
HEMOGLOBIN: 11.9 g/dL — AB (ref 13.0–17.0)
MCH: 27.7 pg (ref 26.0–34.0)
MCHC: 33.2 g/dL (ref 30.0–36.0)
MCV: 83.3 fL (ref 78.0–100.0)
Platelets: 169 10*3/uL (ref 150–400)
RBC: 4.3 MIL/uL (ref 4.22–5.81)
RDW: 13.8 % (ref 11.5–15.5)
WBC: 8.5 10*3/uL (ref 4.0–10.5)

## 2016-11-08 LAB — MAGNESIUM: Magnesium: 2.1 mg/dL (ref 1.7–2.4)

## 2016-11-08 LAB — PREALBUMIN: Prealbumin: 6.9 mg/dL — ABNORMAL LOW (ref 18–38)

## 2016-11-08 LAB — PHOSPHORUS: Phosphorus: <1 mg/dL — CL (ref 2.5–4.6)

## 2016-11-08 LAB — TRIGLYCERIDES: TRIGLYCERIDES: 105 mg/dL (ref <150)

## 2016-11-08 SURGERY — OPEN REDUCTION INTERNAL FIXATION (ORIF) METACARPAL
Anesthesia: General | Laterality: Right

## 2016-11-08 MED ORDER — POTASSIUM PHOSPHATES 15 MMOLE/5ML IV SOLN
40.0000 mmol | Freq: Once | INTRAVENOUS | Status: AC
  Administered 2016-11-08: 40 mmol via INTRAVENOUS
  Filled 2016-11-08: qty 13.33

## 2016-11-08 MED ORDER — TRACE MINERALS CR-CU-MN-SE-ZN 10-1000-500-60 MCG/ML IV SOLN
INTRAVENOUS | Status: AC
  Administered 2016-11-08: 17:00:00 via INTRAVENOUS
  Filled 2016-11-08: qty 1560

## 2016-11-08 MED ORDER — SODIUM CHLORIDE 0.9 % IV SOLN
INTRAVENOUS | Status: AC
  Administered 2016-11-08: 18:00:00 via INTRAVENOUS

## 2016-11-08 MED ORDER — ENOXAPARIN SODIUM 40 MG/0.4ML ~~LOC~~ SOLN
40.0000 mg | SUBCUTANEOUS | Status: DC
  Administered 2016-11-08 – 2016-11-16 (×9): 40 mg via SUBCUTANEOUS
  Filled 2016-11-08 (×9): qty 0.4
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
## 2016-11-08 NOTE — Progress Notes (Signed)
Inpatient Rehabilitation  Per PT request, patient was screened by Matika Bartell for appropriateness for an Inpatient Acute Rehab consult.  At this time we are recommending an Inpatient Rehab consult.  Text paged medical team; please order if you are agreeable.    Romano Stigger, M.A., CCC/SLP Admission Coordinator  [facility]  Cell [phone]  

## 2016-11-08 NOTE — Progress Notes (Signed)
Inpatient Rehabilitation  Attempted to meet with patient to discuss team's recommendation for IP Rehab; however, he was resting.  Shared information with mom and spouse via phone.  Plan to follow up Monday for timing of medical readiness in order to determine when to start insurance authorization.  Call if questions.   Charlane FerrettiMelissa Dezire Turk, M.A., CCC/SLP Admission Coordinator  [facility]  Cell [phone]
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
## 2016-11-08 NOTE — Progress Notes (Signed)
CRITICAL VALUE ALERT  Critical Value: Phosphorus less than 1  Date & Time Notied: 0650 10/19+/18  Provider Notified: Gaynelle AduEric Wilson  Orders Received/Actions taken: replacement ordered.

## 2016-11-09 LAB — BASIC METABOLIC PANEL
ANION GAP: 6 (ref 5–15)
BUN: 8 mg/dL (ref 6–20)
CHLORIDE: 105 mmol/L (ref 101–111)
CO2: 25 mmol/L (ref 22–32)
Calcium: 7.5 mg/dL — ABNORMAL LOW (ref 8.9–10.3)
Creatinine, Ser: 0.53 mg/dL — ABNORMAL LOW (ref 0.61–1.24)
GFR calc non Af Amer: >60 mL/min (ref >60)
Glucose, Bld: 145 mg/dL — ABNORMAL HIGH (ref 65–99)
POTASSIUM: 3.5 mmol/L (ref 3.5–5.1)
SODIUM: 136 mmol/L (ref 135–145)

## 2016-11-09 LAB — CBC
HEMATOCRIT: 34 % — AB (ref 39.0–52.0)
HEMOGLOBIN: 11.4 g/dL — AB (ref 13.0–17.0)
MCH: 28 pg (ref 26.0–34.0)
MCHC: 33.5 g/dL (ref 30.0–36.0)
MCV: 83.5 fL (ref 78.0–100.0)
PLATELETS: 146 10*3/uL — AB (ref 150–400)
RBC: 4.07 MIL/uL — AB (ref 4.22–5.81)
RDW: 13.5 % (ref 11.5–15.5)
WBC: 8.7 10*3/uL (ref 4.0–10.5)

## 2016-11-09 LAB — GLUCOSE, CAPILLARY
GLUCOSE-CAPILLARY: 152 mg/dL — AB (ref 65–99)
GLUCOSE-CAPILLARY: 171 mg/dL — AB (ref 65–99)
Glucose-Capillary: 115 mg/dL — ABNORMAL HIGH (ref 65–99)
Glucose-Capillary: 140 mg/dL — ABNORMAL HIGH (ref 65–99)
Glucose-Capillary: 147 mg/dL — ABNORMAL HIGH (ref 65–99)

## 2016-11-09 LAB — PHOSPHORUS: Phosphorus: 3 mg/dL (ref 2.5–4.6)

## 2016-11-09 MED ORDER — POTASSIUM CHLORIDE 10 MEQ/50ML IV SOLN
10.0000 meq | INTRAVENOUS | Status: AC
  Administered 2016-11-09 (×4): 10 meq via INTRAVENOUS
  Filled 2016-11-09 (×4): qty 50

## 2016-11-09 MED ORDER — M.V.I. ADULT IV INJ
INJECTION | INTRAVENOUS | Status: AC
  Administered 2016-11-09: 18:00:00 via INTRAVENOUS
  Filled 2016-11-09: qty 2160
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
## 2016-11-10 ENCOUNTER — Inpatient Hospital Stay (HOSPITAL_COMMUNITY): Payer: BLUE CROSS/BLUE SHIELD

## 2016-11-10 DIAGNOSIS — M79609 Pain in unspecified limb: Secondary | ICD-10-CM

## 2016-11-10 LAB — BASIC METABOLIC PANEL
Anion gap: 7 (ref 5–15)
BUN: 9 mg/dL (ref 6–20)
CO2: 23 mmol/L (ref 22–32)
Calcium: 7.8 mg/dL — ABNORMAL LOW (ref 8.9–10.3)
Chloride: 105 mmol/L (ref 101–111)
Creatinine, Ser: 0.52 mg/dL — ABNORMAL LOW (ref 0.61–1.24)
GFR calc Af Amer: >60 mL/min (ref >60)
GFR calc non Af Amer: >60 mL/min (ref >60)
GLUCOSE: 124 mg/dL — AB (ref 65–99)
POTASSIUM: 3.7 mmol/L (ref 3.5–5.1)
Sodium: 135 mmol/L (ref 135–145)

## 2016-11-10 LAB — GLUCOSE, CAPILLARY
Glucose-Capillary: 145 mg/dL — ABNORMAL HIGH (ref 65–99)
Glucose-Capillary: 135 mg/dL — ABNORMAL HIGH (ref 65–99)
Glucose-Capillary: 144 mg/dL — ABNORMAL HIGH (ref 65–99)

## 2016-11-10 MED ORDER — ACETAMINOPHEN 160 MG/5ML PO SOLN
650.0000 mg | Freq: Four times a day (QID) | ORAL | Status: DC
  Administered 2016-11-10 – 2016-11-13 (×12): 650 mg
  Filled 2016-11-10 (×12): qty 20.3

## 2016-11-10 MED ORDER — FAT EMULSION 20 % IV EMUL
240.0000 mL | INTRAVENOUS | Status: AC
  Administered 2016-11-10: 240 mL via INTRAVENOUS
  Filled 2016-11-10: qty 250

## 2016-11-10 MED ORDER — MORPHINE SULFATE (PF) 4 MG/ML IV SOLN
2.0000 mg | INTRAVENOUS | Status: DC | PRN
  Administered 2016-11-10: 2 mg via INTRAVENOUS
  Administered 2016-11-10: 4 mg via INTRAVENOUS
  Administered 2016-11-10 – 2016-11-11 (×6): 2 mg via INTRAVENOUS
  Administered 2016-11-12 – 2016-11-13 (×6): 4 mg via INTRAVENOUS
  Filled 2016-11-10 (×14): qty 1

## 2016-11-10 MED ORDER — TRACE MINERALS CR-CU-MN-SE-ZN 10-1000-500-60 MCG/ML IV SOLN
INTRAVENOUS | Status: AC
  Administered 2016-11-10: 19:00:00 via INTRAVENOUS
  Filled 2016-11-10: qty 2160

## 2016-11-10 MED ORDER — POTASSIUM CHLORIDE 10 MEQ/50ML IV SOLN
10.0000 meq | INTRAVENOUS | Status: AC
  Administered 2016-11-10 (×3): 10 meq via INTRAVENOUS
  Filled 2016-11-10 (×3): qty 50

## 2016-11-10 NOTE — Progress Notes (Signed)
VASCULAR LAB PRELIMINARY  ARTERIAL DOPPLER  ABI completed: Bilateral within normal limits.     RIGHT    LEFT    PRESSURE WAVEFORM  PRESSURE WAVEFORM  BRACHIAL N/A line in place Tri BRACHIAL 157 Tri  DP   DP    AT 193 Tri AT 186 Tri  PT 192 Tri PT 194 Tri  PER   PER    GREAT TOE  NA GREAT TOE  NA    RIGHT LEFT  ABI 1.23 1.24     Farrel DemarkJill Eunice, RDMS, RVT  11/10/2016, 2:36 PM
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
## 2016-11-10 NOTE — Plan of Care (Signed)
Problem: Bowel/Gastric: Goal: Gastrointestinal status for postoperative course will improve Outcome: Progressing Patient's bowel sounds are very faint, and he experienced nausea after receiving morphine IV which was resolved with IV Zofran. NG tube connected to intermittent low suction has good output. Continuing to monitor.

## 2016-11-11 ENCOUNTER — Inpatient Hospital Stay (HOSPITAL_COMMUNITY): Payer: BLUE CROSS/BLUE SHIELD

## 2016-11-11 ENCOUNTER — Encounter (HOSPITAL_COMMUNITY): Payer: Self-pay | Admitting: Student

## 2016-11-11 LAB — MAGNESIUM: Magnesium: 2 mg/dL (ref 1.7–2.4)

## 2016-11-11 LAB — CBC
HEMATOCRIT: 35.6 % — AB (ref 39.0–52.0)
Hemoglobin: 12.2 g/dL — ABNORMAL LOW (ref 13.0–17.0)
MCH: 28.4 pg (ref 26.0–34.0)
MCHC: 34.3 g/dL (ref 30.0–36.0)
MCV: 83 fL (ref 78.0–100.0)
PLATELETS: 214 10*3/uL (ref 150–400)
RBC: 4.29 MIL/uL (ref 4.22–5.81)
RDW: 13.8 % (ref 11.5–15.5)
WBC: 17 10*3/uL — AB (ref 4.0–10.5)

## 2016-11-11 LAB — URINALYSIS, ROUTINE W REFLEX MICROSCOPIC
BACTERIA UA: NONE SEEN
Bilirubin Urine: NEGATIVE
GLUCOSE, UA: NEGATIVE mg/dL
Ketones, ur: NEGATIVE mg/dL
Leukocytes, UA: NEGATIVE
NITRITE: NEGATIVE
PROTEIN: 30 mg/dL — AB
SPECIFIC GRAVITY, URINE: 1.02 (ref 1.005–1.030)
Squamous Epithelial / LPF: NONE SEEN
pH: 7 (ref 5.0–8.0)

## 2016-11-11 LAB — COMPREHENSIVE METABOLIC PANEL
ALBUMIN: 2.2 g/dL — AB (ref 3.5–5.0)
ALK PHOS: 112 U/L (ref 38–126)
ALT: 127 U/L — ABNORMAL HIGH (ref 17–63)
ANION GAP: 5 (ref 5–15)
AST: 106 U/L — ABNORMAL HIGH (ref 15–41)
BUN: 9 mg/dL (ref 6–20)
CALCIUM: 7.7 mg/dL — AB (ref 8.9–10.3)
CO2: 23 mmol/L (ref 22–32)
Chloride: 102 mmol/L (ref 101–111)
Creatinine, Ser: 0.55 mg/dL — ABNORMAL LOW (ref 0.61–1.24)
GFR calc non Af Amer: >60 mL/min (ref >60)
GLUCOSE: 144 mg/dL — AB (ref 65–99)
POTASSIUM: 3.9 mmol/L (ref 3.5–5.1)
SODIUM: 130 mmol/L — AB (ref 135–145)
TOTAL PROTEIN: 6.4 g/dL — AB (ref 6.5–8.1)
Total Bilirubin: 2.3 mg/dL — ABNORMAL HIGH (ref 0.3–1.2)

## 2016-11-11 LAB — GLUCOSE, CAPILLARY
GLUCOSE-CAPILLARY: 130 mg/dL — AB (ref 65–99)
GLUCOSE-CAPILLARY: 145 mg/dL — AB (ref 65–99)
GLUCOSE-CAPILLARY: 146 mg/dL — AB (ref 65–99)
GLUCOSE-CAPILLARY: 158 mg/dL — AB (ref 65–99)
Glucose-Capillary: 116 mg/dL — ABNORMAL HIGH (ref 65–99)

## 2016-11-11 LAB — DIFFERENTIAL
BASOS ABS: 0 10*3/uL (ref 0.0–0.1)
Basophils Relative: 0 %
EOS PCT: 1 %
Eosinophils Absolute: 0.2 10*3/uL (ref 0.0–0.7)
LYMPHS ABS: 1.9 10*3/uL (ref 0.7–4.0)
Lymphocytes Relative: 11 %
MONO ABS: 1.2 10*3/uL — AB (ref 0.1–1.0)
Monocytes Relative: 7 %
Neutro Abs: 13.7 10*3/uL — ABNORMAL HIGH (ref 1.7–7.7)
Neutrophils Relative %: 81 %

## 2016-11-11 LAB — PHOSPHORUS: PHOSPHORUS: 3.3 mg/dL (ref 2.5–4.6)

## 2016-11-11 LAB — PREALBUMIN: PREALBUMIN: 13.6 mg/dL — AB (ref 18–38)

## 2016-11-11 LAB — TRIGLYCERIDES: Triglycerides: 82 mg/dL (ref <150)

## 2016-11-11 MED ORDER — POTASSIUM CHLORIDE 10 MEQ/50ML IV SOLN
10.0000 meq | INTRAVENOUS | Status: AC
  Administered 2016-11-11 (×3): 10 meq via INTRAVENOUS
  Filled 2016-11-11 (×3): qty 50

## 2016-11-11 MED ORDER — FAT EMULSION 20 % IV EMUL
240.0000 mL | INTRAVENOUS | Status: AC
  Administered 2016-11-11: 240 mL via INTRAVENOUS
  Filled 2016-11-11: qty 250

## 2016-11-11 MED ORDER — BISACODYL 10 MG RE SUPP
10.0000 mg | Freq: Once | RECTAL | Status: AC
  Administered 2016-11-11: 10 mg via RECTAL
  Filled 2016-11-11: qty 1

## 2016-11-11 MED ORDER — TRACE MINERALS CR-CU-MN-SE-ZN 10-1000-500-60 MCG/ML IV SOLN
INTRAVENOUS | Status: AC
  Administered 2016-11-11: 18:00:00 via INTRAVENOUS
  Filled 2016-11-11: qty 1992

## 2016-11-11 NOTE — Progress Notes (Signed)
Inpatient Rehabilitation  Received an [employer] reps phone number and a claim number from spouse and have reached out to inform them of team's recommendation for IP Rehab for post acute therapies.  I await a call back and plan to update team as I know.  Call if questions.   Charlane FerrettiMelissa Summit Borchardt, M.A., CCC/SLP Admission Coordinator  [facility]  Cell [phone]

## 2016-11-11 NOTE — Discharge Instructions (Signed)
Ankle Pumps    Point toes down, then up. Repeat 20 times both ankles.  Do 3 sessions each day.     Knee Presses  Tighten top of left thigh. Hold for 5 seconds. Relax for 3 seconds. Repeat 10 times.  Repeat with other leg.  Do 3 sessions each day   Heel Slides   Slide right heel along bed towards bottom. Hold for 3 seconds. Slide back to flat knee position. Repeat 10 times both sides. Do 3 sessions each day.   Leg out to the side   Lie on your back.  Slide your right leg out to the side and then pull it back to the center.  Keep your toes pointed toward the ceiling.  Repeat 10 times both sides.  Do 3 sessions each day.          Long Kicks   Sit on a firm seat and slowly kick your right foot up.  It is important to get your knee straight.  Hold for 3 seconds.  Relax for 3 seconds.  Repeat 10 times both sides.  Do 3 sessions each day.   Copyright  VHI. All rights reserved.
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
## 2016-11-11 NOTE — Progress Notes (Signed)
Occupational Therapy Treatment Patient Details Name: Evan Landry MRN: [id number] DOB: 11/15/1985 Today's Date: 11/11/2016    History of present illness 31 y.o. male [facility]02/16 [facility] resultant pelvic ring fx s/p SI screw, ex fix 11/03/16, ORIF 11/07/16 (NWB bil LEs), L radius fx s/p ORIF 11/03/16, R 2nd MC fx s.p ORIF 11/06/16, large scrotal hematoma from pelvic fx with acute urinary retention, ileum, ABL anemia (s/p 2 units of PRBCs 11/07/16).  Pt with no significant PMH.    OT comments  Pt making good progress with adls despite his limitations with weight bearing status.  Pt able to brush teeth, donn deodorant, assist with bathing and able to assist with transfers hooking his arm at level of the elbow.  Pt very motivated to be independent and doing well.    Follow Up Recommendations  CIR;Supervision/Assistance - 24 hour    Equipment Recommendations  Other (comment) (drop arm BSC slide board)    Recommendations for Other Services      Precautions / Restrictions Precautions Precautions: Fall Restrictions Weight Bearing Restrictions: Yes RUE Weight Bearing: Weight bear through elbow only LUE Weight Bearing: Weight bear through elbow only RLE Weight Bearing: Non weight bearing LLE Weight Bearing: Non weight bearing       Mobility Bed Mobility Overal bed mobility: Needs Assistance Bed Mobility: Rolling;Sidelying to Sit Rolling: Max assist Sidelying to sit: +2 for physical assistance;Mod assist       General bed mobility comments: pt hooking with B elbows to roll and to sit up/slide transfer.  Encourage pt to hook from proximal part of arm closest to elbow, not near wrist.   Transfers Overall transfer level: Needs assistance Equipment used: 2 person hand held assist (sliding board) Transfers: Lateral/Scoot Transfers          Lateral/Scoot Transfers: Max assist;With slide board General transfer comment: Pt did well with lateral scoot transfer into  chair with slide board and board underneath a bed pad.      Balance Overall balance assessment: Needs assistance Sitting-balance support: No upper extremity supported;Feet supported Sitting balance-Leahy Scale: Good Sitting balance - Comments: Pt can sit on EOB once positioned with S.                                   ADL either performed or assessed with clinical judgement   ADL Overall ADL's : Needs assistance/impaired Eating/Feeding: NPO Eating/Feeding Details (indicate cue type and reason): Pt able to use a spoon/fork in R hand without difficulty so should be able to self feed when that time comes.  Pt will need assist to cut food due to L thumb being splinted. Grooming: Wash/dry face;Oral care;Applying deodorant;Set up;Sitting Grooming Details (indicate cue type and reason): Pt able to open toothpaste tube and brush teeth with some assist for set up. Pt can get deodorant to underarms and can reach hair with R hand. Pt can wash face with set up to squeeze water out of washcloth. Upper Body Bathing: Maximal assistance   Lower Body Bathing: Maximal assistance;Bed level   Upper Body Dressing : Moderate assistance;Sitting Upper Body Dressing Details (indicate cue type and reason): with a loose tshirt only Lower Body Dressing: Total assistance;Bed level   Toilet Transfer: Maximal assistance;+2 for physical assistance;Transfer board;Requires drop arm;BSC (lateral scoot) Toilet Transfer Details (indicate cue type and reason): simulated into drop arm chair.   Toileting- Clothing Manipulation and Hygiene: Total assistance;+2  for physical assistance;Bed level       Functional mobility during ADLs: Maximal assistance;+2 for physical assistance General ADL Comments: Pt is able to assist with adl transfers through hooking arm and pulling from elbow.  Pt doing well using B hands for adl tasks.  Pt very limited with LE due to pain.     Vision   Vision Assessment?: No apparent  visual deficits   Perception     Praxis      Cognition Arousal/Alertness: Awake/alert Behavior During Therapy: WFL for tasks assessed/performed Overall Cognitive Status: Within Functional Limits for tasks assessed                                 General Comments: Pt in chair.  Educated pt on pressure relief while in chair. Put chair pads under elbows to allow pt to push down through elbows to do some pressure relief in sitting.        Exercises     Shoulder Instructions       General Comments Pt able to do upper extremity adls better than eval date.  Pt motivated and does well following NWB precautions yet attempting to be more independent.    Pertinent Vitals/ Pain       Pain Assessment: 0-10 Pain Score: 5  Pain Location: left side of chest and pelvis Pain Descriptors / Indicators: Aching;Grimacing;Guarding;Sore Pain Intervention(s): Limited activity within patient's tolerance;Monitored during session;Premedicated before session;Repositioned  Home Living                                          Prior Functioning/Environment              Frequency  Min 2X/week        Progress Toward Goals  OT Goals(current goals can now be found in the care plan section)  Progress towards OT goals: Progressing toward goals  Acute Rehab OT Goals Patient Stated Goal: to get better and be more independent again OT Goal Formulation: With patient Time For Goal Achievement: 11/22/16 Potential to Achieve Goals: Good ADL Goals Pt Will Perform Eating: with min assist;with adaptive utensils;sitting Pt Will Perform Grooming: with min assist;sitting;with adaptive equipment Additional ADL Goal #1: Pt will perform bed mobility with min assist adhering to WB precautions. Additional ADL Goal #2: Family will be knowledgeble in assisting pt with transfers as appropriate.  Plan Discharge plan remains appropriate    Co-evaluation    PT/OT/SLP  Co-Evaluation/Treatment: Yes Reason for Co-Treatment: Complexity of the patient's impairments (multi-system involvement);For patient/therapist safety;To address functional/ADL transfers PT goals addressed during session: Mobility/safety with mobility OT goals addressed during session: ADL's and self-care      AM-PAC PT "6 Clicks" Daily Activity     Outcome Measure   Help from another person eating meals?: Total Help from another person taking care of personal grooming?: A Little Help from another person toileting, which includes using toliet, bedpan, or urinal?: Total Help from another person bathing (including washing, rinsing, drying)?: A Lot Help from another person to put on and taking off regular upper body clothing?: A Lot Help from another person to put on and taking off regular lower body clothing?: Total 6 Click Score: 10    End of Session Equipment Utilized During Treatment: Other (comment) (slide board)  OT Visit Diagnosis: Pain Pain -  part of body:  (pelvis)   Activity Tolerance Patient tolerated treatment well   Patient Left in chair;with call bell/phone within reach;with nursing/sitter in room   Nurse Communication          Time: 1610-9604 OT Time Calculation (min): 40 min  Charges: OT General Charges $OT Visit: 1 Visit OT Treatments $Self Care/Home Management : 8-22 mins $Therapeutic Activity: 8-22 mins  Tory Emerald, OTR/L   Hope Budds 11/11/2016, 10:19 AM

## 2016-11-11 NOTE — Progress Notes (Signed)
[facility] Progress Note  5 Days Post-Op  Subjective: CC: ileus Patient belching but not passing any flatus. No abdominal pain or nausea. Wanting to know about when dressings can come off UEs.  UOP good. VSS.   Objective: Vital signs in last 24 hours: Temp:  [97.8 F (36.6 C)-99.8 F (37.7 C)] 97.8 F (36.6 C) (10/22 0801) Pulse Rate:  [75-106] 80 (10/22 0800) Resp:  [23-41] 41 (10/22 0800) BP: (147-171)/(78-127) 147/78 (10/22 0800) SpO2:  [96 %-100 %] 99 % (10/22 0800) Last BM Date: 11/02/16  Intake/Output from previous day: 10/21 0701 - 10/22 0700 In: 2350 [I.V.:2200; IV Piggyback:150] Out: 2700 [Urine:2200; Emesis/NG output:500] Intake/Output this shift: Total I/O In: -  Out: 200 [Urine:200]  PE: Gen:  Alert, NAD, pleasant Card:  Mildly tachycardic, regular rhythm, pedal pulses 2+ BL Pulm:  Normal effort, clear to auscultation bilaterally Abd: non-tender, distended, bowel sounds hypoactive, no HSM Ext: BL UE in short arm splints, fingers WWP, sensation and motor intact; sensation and motor intact in BL LE, no LE edema BL Skin: warm and dry, no rashes  Psych: A&Ox3   Lab Results:   Recent Labs  11/09/16 0443 11/11/16 0500  WBC 8.7 17.0*  HGB 11.4* 12.2*  HCT 34.0* 35.6*  PLT 146* 214   BMET  Recent Labs  11/10/16 0805 11/11/16 0500  NA 135 130*  K 3.7 3.9  CL 105 102  CO2 23 23  GLUCOSE 124* 144*  BUN 9 9  CREATININE 0.52* 0.55*  CALCIUM 7.8* 7.7*   PT/INR No results for input(s): LABPROT, INR in the last 72 hours. CMP     Component Value Date/Time   NA 130 (L) 11/11/2016 0500   K 3.9 11/11/2016 0500   CL 102 11/11/2016 0500   CO2 23 11/11/2016 0500   GLUCOSE 144 (H) 11/11/2016 0500   BUN 9 11/11/2016 0500   CREATININE 0.55 (L) 11/11/2016 0500   CALCIUM 7.7 (L) 11/11/2016 0500   PROT 6.4 (L) 11/11/2016 0500   ALBUMIN 2.2 (L) 11/11/2016 0500   AST 106 (H) 11/11/2016 0500   ALT 127 (H) 11/11/2016 0500   ALKPHOS 112  11/11/2016 0500   BILITOT 2.3 (H) 11/11/2016 0500   GFRNONAA >60 11/11/2016 0500   GFRAA >60 11/11/2016 0500   Lipase  No results found for: LIPASE     Studies/Results: Dg Abd Portable 1v  Result Date: 11/11/2016 CLINICAL DATA:  Ileus.  Distention. EXAM: PORTABLE ABDOMEN - 1 VIEW COMPARISON:  CT of the pelvis 11/07/2016. FINDINGS: Multiple dilated loops of small bowel are present. Dilated loops measure over 5 cm. There is some gas within the colon. No definite free air is present. The postsurgical changes the pelvis are again noted. IMPRESSION: 1. Moderate dilation of multiple loops of small bowel compatible with ileus versus developing small bowel obstruction. Electronically Signed   By: Marin Robertshristopher  Mattern M.D.   On: 11/11/2016 07:44    Anti-infectives: Anti-infectives    Start     Dose/Rate Route Frequency Ordered Stop   11/07/16 0000  ceFAZolin (ANCEF) IVPB 2g/100 mL premix     2 g 200 mL/hr over 30 Minutes Intravenous Every 8 hours 11/06/16 2140 11/07/16 1348   11/06/16 1603  vancomycin (VANCOCIN) powder  Status:  Discontinued       As needed 11/06/16 1603 11/06/16 1834   11/06/16 1601  tobramycin (NEBCIN) powder  Status:  Discontinued       As needed 11/06/16 1602 11/06/16 1834   11/06/16 1106  ceFAZolin (ANCEF) 2-4 GM/100ML-% IVPB    Comments:  [name], Beth   : cabinet override      11/06/16 1106 11/06/16 2314   11/03/16 1600  vancomycin (VANCOCIN) IVPB 1000 mg/200 mL premix     1,000 mg 200 mL/hr over 60 Minutes Intravenous  Once 11/03/16 1546 11/03/16 1612       Assessment/Plan [facility] APC 3 pelvic ring FX- S/P SI screw, ex fix by Dr. Jena Gauss 10/14, S/P ORIF 10/18 Dr. Jena Gauss; BLE NWB.  - incisions open to air - slider board transfers with WB through elbows L radius FX- S/P ORIF by Dr. Merlyn Lot 10/14, F/U 1 week post-op R 2nd MC FX- ORIF by Dr. Mina Marble 10/17, F/U 1 week post-op Acute urinary retention- large scrotal hematoma from pelvic FX, contiuefoley for  now Grade 1 liver lac Ileus- NGT and await bowel function  - NGT with 500 cc output in 24 h - XR with ileus vs SBO, hypoactive BS, no flatus - try dulcolax suppository today ABL anemia- 2 u PRBC 10/18; hgb 11.9/hct 35.8 today, stable  CV- lopressor has helped HR and BP but still elevated at times due to pain; improving   VTE- SCD's, hgb/hct stable. lovenox ID: WBC 17, afebrile - UA and CXR ordered  Pain: IV morphine, scheduled Tylenol per tube Foley: 10/14, continue FEN - NPO, TNA 10/18 >>, phos replaced and stable ; try dulcolax suppository today  Dispo-US with nml ABI BL. Workup for leukocytosis pending, afebrile. Dulcolax suppository.   LOS: 8 days    Wells Guiles , [facility] 11/11/2016, 10:12 AM Pager: [phone] Trauma Pager: [phone] Mon-Fri 7:00 am-4:30 pm Sat-Sun 7:00 am-11:30 am

## 2016-11-11 NOTE — Plan of Care (Signed)
Problem: Bowel/Gastric: Goal: Gastrointestinal status for postoperative course will improve Outcome: Progressing Pt with two bowel movements after suppository
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
## 2016-11-12 LAB — URINE CULTURE
CULTURE: NO GROWTH
SPECIAL REQUESTS: NORMAL

## 2016-11-12 LAB — GLUCOSE, CAPILLARY
GLUCOSE-CAPILLARY: 135 mg/dL — AB (ref 65–99)
GLUCOSE-CAPILLARY: 141 mg/dL — AB (ref 65–99)
GLUCOSE-CAPILLARY: 149 mg/dL — AB (ref 65–99)
Glucose-Capillary: 129 mg/dL — ABNORMAL HIGH (ref 65–99)
Glucose-Capillary: 123 mg/dL — ABNORMAL HIGH (ref 65–99)

## 2016-11-12 MED ORDER — BISACODYL 10 MG RE SUPP
10.0000 mg | Freq: Every day | RECTAL | Status: DC | PRN
  Administered 2016-11-13 – 2016-11-15 (×3): 10 mg via RECTAL
  Filled 2016-11-12 (×3): qty 1

## 2016-11-12 MED ORDER — FAT EMULSION 20 % IV EMUL
240.0000 mL | INTRAVENOUS | Status: AC
  Administered 2016-11-12: 240 mL via INTRAVENOUS
  Filled 2016-11-12: qty 250

## 2016-11-12 MED ORDER — TRACE MINERALS CR-CU-MN-SE-ZN 10-1000-500-60 MCG/ML IV SOLN
INTRAVENOUS | Status: AC
  Administered 2016-11-12: 17:00:00 via INTRAVENOUS
  Filled 2016-11-12: qty 1992

## 2016-11-12 MED ORDER — BOOST / RESOURCE BREEZE PO LIQD
1.0000 | Freq: Two times a day (BID) | ORAL | Status: DC
  Administered 2016-11-12 – 2016-11-14 (×4): 1 via ORAL
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
## 2016-11-13 LAB — CBC
HCT: 36.7 % — ABNORMAL LOW (ref 39.0–52.0)
Hemoglobin: 12.3 g/dL — ABNORMAL LOW (ref 13.0–17.0)
MCH: 28.3 pg (ref 26.0–34.0)
MCHC: 33.5 g/dL (ref 30.0–36.0)
MCV: 84.4 fL (ref 78.0–100.0)
PLATELETS: 308 10*3/uL (ref 150–400)
RBC: 4.35 MIL/uL (ref 4.22–5.81)
RDW: 13.8 % (ref 11.5–15.5)
WBC: 18 10*3/uL — AB (ref 4.0–10.5)

## 2016-11-13 LAB — GLUCOSE, CAPILLARY
GLUCOSE-CAPILLARY: 106 mg/dL — AB (ref 65–99)
GLUCOSE-CAPILLARY: 127 mg/dL — AB (ref 65–99)
Glucose-Capillary: 129 mg/dL — ABNORMAL HIGH (ref 65–99)

## 2016-11-13 LAB — BASIC METABOLIC PANEL
Anion gap: 10 (ref 5–15)
BUN: 9 mg/dL (ref 6–20)
CALCIUM: 8.2 mg/dL — AB (ref 8.9–10.3)
CO2: 24 mmol/L (ref 22–32)
CREATININE: 0.51 mg/dL — AB (ref 0.61–1.24)
Chloride: 99 mmol/L — ABNORMAL LOW (ref 101–111)
Glucose, Bld: 130 mg/dL — ABNORMAL HIGH (ref 65–99)
Potassium: 4.3 mmol/L (ref 3.5–5.1)
SODIUM: 133 mmol/L — AB (ref 135–145)

## 2016-11-13 MED ORDER — MORPHINE SULFATE (PF) 4 MG/ML IV SOLN
2.0000 mg | INTRAVENOUS | Status: DC | PRN
  Administered 2016-11-13 – 2016-11-15 (×3): 4 mg via INTRAVENOUS
  Filled 2016-11-13 (×3): qty 1

## 2016-11-13 MED ORDER — TRAVASOL 10 % IV SOLN
INTRAVENOUS | Status: AC
  Administered 2016-11-13: 18:00:00 via INTRAVENOUS
  Filled 2016-11-13: qty 1094.4

## 2016-11-13 MED ORDER — ACETAMINOPHEN 325 MG PO TABS
650.0000 mg | ORAL_TABLET | Freq: Four times a day (QID) | ORAL | Status: DC
  Administered 2016-11-13 – 2016-11-16 (×13): 650 mg via ORAL
  Filled 2016-11-13 (×13): qty 2

## 2016-11-13 MED ORDER — OXYCODONE HCL 5 MG PO TABS
5.0000 mg | ORAL_TABLET | ORAL | Status: DC | PRN
  Administered 2016-11-13 – 2016-11-16 (×9): 10 mg via ORAL
  Filled 2016-11-13 (×11): qty 2

## 2016-11-13 MED ORDER — METHOCARBAMOL 500 MG PO TABS
500.0000 mg | ORAL_TABLET | Freq: Three times a day (TID) | ORAL | Status: DC | PRN
Start: 2016-11-13 — End: 2016-11-15
  Administered 2016-11-13 – 2016-11-15 (×4): 500 mg via ORAL
  Filled 2016-11-13 (×4): qty 1

## 2016-11-13 NOTE — Progress Notes (Signed)
Orthopedic Tech Progress Note Patient Details:  Evan Landry 03/13/1985 [id number]  Ortho Devices Type of Ortho Device: Ace wrap, Volar splint Ortho Device/Splint Location: RUE Ortho Device/Splint Interventions: Ordered, Application   Jennye MoccasinHughes, Biridiana Twardowski Craig 11/13/2016, 4:08 PM

## 2016-11-13 NOTE — Progress Notes (Signed)
Inpatient Rehabilitation  I have again called Haywood LassoLynette with [employer] to request how to go about getting authorization for an IP Rehab admission when the patient is medically ready.  Will updated the team when I have a response.  Call with questions.   Charlane FerrettiMelissa Ireta Pullman, M.A., CCC/SLP Admission Coordinator  [facility]  Cell [phone]

## 2016-11-13 NOTE — Progress Notes (Signed)
Orthopedic Hand Surgery saw patient today; bandages removed & wounds looked good.  Pain scale 4 & no other complaints.  Wounds without any signs of infection,fingers good color & warm to touch.  Light dressings applied to bilateral wounds with thumb splint replaced.  Occupational Therapy is to come & apply splints bilaterally [facility]  Sutures to be removed next week & patient to follow up in office after discharged.

## 2016-11-13 NOTE — Addendum Note (Signed)
Addendum  created 11/13/16 1256 by Lewie LoronGermeroth, Whitney Hillegass, MD   Anesthesia Staff edited
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
## 2016-11-13 NOTE — Progress Notes (Signed)
Wife requests letter prepared for pt's short term disability through [employer].  Prepared letter per her request and faxed accordingly to [phone].  Original copy given to patient; he is appreciative of help.    Kanon Colunga W. Yazmeen Woolf, RN, BSN  Trauma/Neuro ICU Case Manager [phone] 

## 2016-11-14 LAB — COMPREHENSIVE METABOLIC PANEL
ALT: 113 U/L — ABNORMAL HIGH (ref 17–63)
ANION GAP: 7 (ref 5–15)
AST: 52 U/L — ABNORMAL HIGH (ref 15–41)
Albumin: 2.3 g/dL — ABNORMAL LOW (ref 3.5–5.0)
Alkaline Phosphatase: 172 U/L — ABNORMAL HIGH (ref 38–126)
BILIRUBIN TOTAL: 1.6 mg/dL — AB (ref 0.3–1.2)
BUN: 10 mg/dL (ref 6–20)
CO2: 24 mmol/L (ref 22–32)
Calcium: 8.1 mg/dL — ABNORMAL LOW (ref 8.9–10.3)
Chloride: 102 mmol/L (ref 101–111)
Creatinine, Ser: 0.49 mg/dL — ABNORMAL LOW (ref 0.61–1.24)
GFR calc Af Amer: >60 mL/min (ref >60)
Glucose, Bld: 129 mg/dL — ABNORMAL HIGH (ref 65–99)
POTASSIUM: 4.4 mmol/L (ref 3.5–5.1)
Sodium: 133 mmol/L — ABNORMAL LOW (ref 135–145)
TOTAL PROTEIN: 6.7 g/dL (ref 6.5–8.1)

## 2016-11-14 LAB — MAGNESIUM: MAGNESIUM: 1.9 mg/dL (ref 1.7–2.4)

## 2016-11-14 LAB — CBC
HCT: 35.8 % — ABNORMAL LOW (ref 39.0–52.0)
Hemoglobin: 11.9 g/dL — ABNORMAL LOW (ref 13.0–17.0)
MCH: 28.1 pg (ref 26.0–34.0)
MCHC: 33.2 g/dL (ref 30.0–36.0)
MCV: 84.6 fL (ref 78.0–100.0)
PLATELETS: 302 10*3/uL (ref 150–400)
RBC: 4.23 MIL/uL (ref 4.22–5.81)
RDW: 14.1 % (ref 11.5–15.5)
WBC: 15.9 10*3/uL — AB (ref 4.0–10.5)

## 2016-11-14 LAB — GLUCOSE, CAPILLARY
GLUCOSE-CAPILLARY: 132 mg/dL — AB (ref 65–99)
GLUCOSE-CAPILLARY: 118 mg/dL — AB (ref 65–99)
Glucose-Capillary: 154 mg/dL — ABNORMAL HIGH (ref 65–99)

## 2016-11-14 LAB — PHOSPHORUS: Phosphorus: 3.5 mg/dL (ref 2.5–4.6)

## 2016-11-14 MED ORDER — METOPROLOL TARTRATE 25 MG PO TABS
25.0000 mg | ORAL_TABLET | Freq: Two times a day (BID) | ORAL | Status: DC
  Administered 2016-11-14 – 2016-11-16 (×5): 25 mg via ORAL
  Filled 2016-11-14 (×5): qty 1

## 2016-11-14 MED ORDER — TRAVASOL 10 % IV SOLN
INTRAVENOUS | Status: DC
  Administered 2016-11-14: 18:00:00 via INTRAVENOUS
  Filled 2016-11-14: qty 547.2

## 2016-11-14 MED ORDER — ENSURE ENLIVE PO LIQD
237.0000 mL | Freq: Four times a day (QID) | ORAL | Status: DC
  Administered 2016-11-14 – 2016-11-16 (×6): 237 mL via ORAL
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
## 2016-11-14 NOTE — Progress Notes (Signed)
OT Note - Addendum    11/14/16 1407  OT Visit Information  Last OT Received On 11/14/16  ADL Goals  Additional ADL Goal #1 Pt will tolerate B hand splints without complaints to improve functional positioning  OT Treatments  $Orthotics Fit/Training 53-67 mins  $ Splint materials basic 1 Supply  [facility] Dwanna Goshert, OT/L  [phone] 11/14/2016

## 2016-11-14 NOTE — Progress Notes (Signed)
Inpatient Rehabilitation  Met with patient and spouse to discuss team's recommendation for IP Rehab as well as update from 3rd party liability, Lynette with [employer].  Discused insurnace verification letter and answered questions.  All are in agreement to initiate insurance authorization from [facility] in hopes of potential admission to IP Rehab Saturday, 11/16/16.  Discussed with nurse case manager.  Call if questions.   Carmelia Roller., CCC/SLP Admission Coordinator  [place]  Cell [phone]
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
## 2016-11-14 NOTE — Plan of Care (Signed)
Problem: Urinary Elimination: Goal: Ability to achieve and maintain adequate urine output will improve Outcome: Progressing Patient foley d/c'd per order, continue to monitor UO.

## 2016-11-14 NOTE — Plan of Care (Signed)
Problem: Pain Management: Goal: Pain level will decrease Outcome: Progressing Patient encouraged to ask for pain medicine before 10/10. Encouraged PO pain medication   Problem: Urinary Elimination: Goal: Will remain free from infection Outcome: Progressing Afebrile
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
## 2016-11-14 NOTE — Progress Notes (Signed)
11/14/2016 8:39 PM   Patient complains of increased abdominal bloating, satiety since breakfast but denies nausea. NGT out on 10/23. Current diet advanced to soft today. Will give already ordered dulcolax PR. White MD aware. No new orders received. Patient and wife updated.     Carlyon ProwsShakiera Mahreen Schewe RN
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
## 2016-11-15 LAB — CBC
HCT: 37.7 % — ABNORMAL LOW (ref 39.0–52.0)
Hemoglobin: 12.2 g/dL — ABNORMAL LOW (ref 13.0–17.0)
MCH: 27.7 pg (ref 26.0–34.0)
MCHC: 32.4 g/dL (ref 30.0–36.0)
MCV: 85.5 fL (ref 78.0–100.0)
PLATELETS: 399 10*3/uL (ref 150–400)
RBC: 4.41 MIL/uL (ref 4.22–5.81)
RDW: 14.3 % (ref 11.5–15.5)
WBC: 17.1 10*3/uL — AB (ref 4.0–10.5)

## 2016-11-15 LAB — URINALYSIS, ROUTINE W REFLEX MICROSCOPIC
Bilirubin Urine: NEGATIVE
GLUCOSE, UA: NEGATIVE mg/dL
HGB URINE DIPSTICK: NEGATIVE
KETONES UR: NEGATIVE mg/dL
LEUKOCYTES UA: NEGATIVE
Nitrite: NEGATIVE
PROTEIN: NEGATIVE mg/dL
Specific Gravity, Urine: 1.02 (ref 1.005–1.030)
pH: 7 (ref 5.0–8.0)

## 2016-11-15 LAB — GLUCOSE, CAPILLARY
GLUCOSE-CAPILLARY: 103 mg/dL — AB (ref 65–99)
Glucose-Capillary: 127 mg/dL — ABNORMAL HIGH (ref 65–99)
Glucose-Capillary: 110 mg/dL — ABNORMAL HIGH (ref 65–99)

## 2016-11-15 MED ORDER — POLYETHYLENE GLYCOL 3350 17 G PO PACK
17.0000 g | PACK | Freq: Two times a day (BID) | ORAL | Status: DC
  Administered 2016-11-15 – 2016-11-16 (×2): 17 g via ORAL
  Filled 2016-11-15 (×3): qty 1

## 2016-11-15 MED ORDER — METOCLOPRAMIDE HCL 10 MG PO TABS
5.0000 mg | ORAL_TABLET | Freq: Three times a day (TID) | ORAL | Status: DC
  Administered 2016-11-15 – 2016-11-16 (×4): 5 mg via ORAL
  Filled 2016-11-15 (×5): qty 1

## 2016-11-15 MED ORDER — POLYETHYLENE GLYCOL 3350 17 G PO PACK
17.0000 g | PACK | Freq: Once | ORAL | Status: AC
  Administered 2016-11-15: 17 g via ORAL

## 2016-11-15 MED ORDER — TRAVASOL 10 % IV SOLN
INTRAVENOUS | Status: DC
  Administered 2016-11-15: 18:00:00 via INTRAVENOUS
  Filled 2016-11-15: qty 547.2
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
## 2016-11-15 NOTE — Progress Notes (Signed)
PT Cancellation Note  Patient Details Name: Evan Landry MRN: [id number] DOB: 03/22/1985   Cancelled Treatment:    Reason Eval/Treat Not Completed: Other (comment) Pt just recently got a sappository and laxative.  He is trying very hard to have a BM and is currently very uncomfortable.  He does not usually decline mobility, but he did today, even when [facility] natural [facility] position.  PT will f/u on Monday 11/18/16 if he has not d/c to CIR yet.   Thanks,   Rollene Rotundaebecca B. Branko Steeves, PT, DPT [phone]   11/15/2016, 3:49 PM
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
## 2016-11-15 NOTE — Progress Notes (Signed)
Pt c/o continued abdominal discomfort throughout day. Patient continues passing flatulence with positive but hypoactive bowel sounds. Reglan added in AM. Spoke to PA early afternoon, miralax added to medication regimen. Patient encouraged to lay on side. Suppository given, miralax given, small bowel movement resulted giving pt some relief. Abdomen still quite distended though softer some than AM. Continue to monitor patient.

## 2016-11-15 NOTE — Progress Notes (Signed)
Trauma Service Note  Subjective: Patient a little more uncomfortable this AM in his abdomen.    Objective: Vital signs in last 24 hours: Temp:  [98.2 F (36.8 C)-98.8 F (37.1 C)] 98.3 F (36.8 C) (10/26 0800) Pulse Rate:  [86-117] 91 (10/26 0800) Resp:  [23-36] 27 (10/26 0800) BP: (144-154)/(68-95) 152/93 (10/26 0800) SpO2:  [97 %-100 %] 98 % (10/26 0800) Weight:  [76.1 kg (167 lb 12.8 oz)] 76.1 kg (167 lb 12.8 oz) (10/25 1000) Last BM Date: 11/14/16  Intake/Output from previous day: 10/25 0701 - 10/26 0700 In: 2232.7 [P.O.:720; I.V.:1512.7] Out: 2227 [Urine:2225; Stool:2] Intake/Output this shift: No intake/output data recorded.  General: No severe distress.    Lungs: Clear  Abd: Still having bowel movements and passing gas, but slightly more distended this AM.    Extremities: No changes  Neuro: Intact  Lab Results: CBC   Recent Labs  11/14/16 0602 11/15/16 0516  WBC 15.9* 17.1*  HGB 11.9* 12.2*  HCT 35.8* 37.7*  PLT 302 399   BMET  Recent Labs  11/13/16 0437 11/14/16 0602  NA 133* 133*  K 4.3 4.4  CL 99* 102  CO2 24 24  GLUCOSE 130* 129*  BUN 9 10  CREATININE 0.51* 0.49*  CALCIUM 8.2* 8.1*   PT/INR No results for input(s): LABPROT, INR in the last 72 hours. ABG No results for input(s): PHART, HCO3 in the last 72 hours.  Invalid input(s): PCO2, PO2  Studies/Results: No results found.  Anti-infectives: Anti-infectives    Start     Dose/Rate Route Frequency Ordered Stop   11/07/16 0000  ceFAZolin (ANCEF) IVPB 2g/100 mL premix     2 g 200 mL/hr over 30 Minutes Intravenous Every 8 hours 11/06/16 2140 11/07/16 1348   11/06/16 1603  vancomycin (VANCOCIN) powder  Status:  Discontinued       As needed 11/06/16 1603 11/06/16 1834   11/06/16 1601  tobramycin (NEBCIN) powder  Status:  Discontinued       As needed 11/06/16 1602 11/06/16 1834   11/06/16 1106  ceFAZolin (ANCEF) 2-4 GM/100ML-% IVPB    Comments:  Gaynell FaceMarshall, Beth   : cabinet override       11/06/16 1106 11/06/16 2314   11/03/16 1600  vancomycin (VANCOCIN) IVPB 1000 mg/200 mL premix     1,000 mg 200 mL/hr over 60 Minutes Intravenous  Once 11/03/16 1546 11/03/16 1612      Assessment/Plan: s/p Procedure(s): OPEN REDUCTION INTERNAL FIXATION (ORIF) PELVIC FRACTURE OPEN REDUCTION INTERNAL FIXATION (ORIF) METACARPAL Mild ileus with rising WBC  Not sure why this patient is having these problems.  It may be necessary to get Abd and Pelvic CT with contrast  LOS: 12 days   Marta LamasJames O. Gae BonWyatt, III, MD, FACS [phone] Trauma Surgeon 11/15/2016

## 2016-11-15 NOTE — Progress Notes (Signed)
Inpatient Rehabilitation  [facility] and am hopeful to have a bed to offer tomorrow, 11/16/16.  Will clarify plan with the team later today.  Call if questions.   Charlane FerrettiMelissa Etha Stambaugh, M.A., CCC/SLP Admission Coordinator  [facility]  Cell [phone]
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
## 2016-11-16 ENCOUNTER — Encounter (HOSPITAL_COMMUNITY): Payer: Self-pay

## 2016-11-16 ENCOUNTER — Inpatient Hospital Stay (HOSPITAL_COMMUNITY)
Admission: EM | Admit: 2016-11-16 | Discharge: 2016-11-28 | DRG: 560 | Disposition: A | Discharge status: Home Health | Payer: BLUE CROSS/BLUE SHIELD | Source: Intra-hospital | Attending: Physical Medicine & Rehabilitation | Admitting: Physical Medicine & Rehabilitation

## 2016-11-16 DIAGNOSIS — R740 Nonspecific elevation of levels of transaminase and lactic acid dehydrogenase [LDH]: Secondary | ICD-10-CM | POA: Diagnosis present

## 2016-11-16 DIAGNOSIS — K567 Ileus, unspecified: Secondary | ICD-10-CM

## 2016-11-16 DIAGNOSIS — D72829 Elevated white blood cell count, unspecified: Secondary | ICD-10-CM | POA: Diagnosis present

## 2016-11-16 DIAGNOSIS — K9189 Other postprocedural complications and disorders of digestive system: Secondary | ICD-10-CM | POA: Diagnosis not present

## 2016-11-16 DIAGNOSIS — M62838 Other muscle spasm: Secondary | ICD-10-CM | POA: Diagnosis present

## 2016-11-16 DIAGNOSIS — D62 Acute posthemorrhagic anemia: Secondary | ICD-10-CM | POA: Diagnosis present

## 2016-11-16 DIAGNOSIS — S32810D Multiple fractures of pelvis with stable disruption of pelvic ring, subsequent encounter for fracture with routine healing: Secondary | ICD-10-CM | POA: Diagnosis present

## 2016-11-16 DIAGNOSIS — S62300D Unspecified fracture of second metacarpal bone, right hand, subsequent encounter for fracture with routine healing: Secondary | ICD-10-CM | POA: Diagnosis not present

## 2016-11-16 DIAGNOSIS — T07XXXA Unspecified multiple injuries, initial encounter: Secondary | ICD-10-CM

## 2016-11-16 DIAGNOSIS — S32401D Unspecified fracture of right acetabulum, subsequent encounter for fracture with routine healing: Secondary | ICD-10-CM | POA: Diagnosis not present

## 2016-11-16 DIAGNOSIS — S62202D Unspecified fracture of first metacarpal bone, left hand, subsequent encounter for fracture with routine healing: Secondary | ICD-10-CM | POA: Diagnosis not present

## 2016-11-16 DIAGNOSIS — G8918 Other acute postprocedural pain: Secondary | ICD-10-CM

## 2016-11-16 DIAGNOSIS — R000 Tachycardia, unspecified: Secondary | ICD-10-CM

## 2016-11-16 DIAGNOSIS — E871 Hypo-osmolality and hyponatremia: Secondary | ICD-10-CM | POA: Diagnosis present

## 2016-11-16 DIAGNOSIS — R748 Abnormal levels of other serum enzymes: Secondary | ICD-10-CM

## 2016-11-16 DIAGNOSIS — T148XXA Other injury of unspecified body region, initial encounter: Secondary | ICD-10-CM | POA: Diagnosis not present

## 2016-11-16 DIAGNOSIS — M7989 Other specified soft tissue disorders: Secondary | ICD-10-CM | POA: Diagnosis not present

## 2016-11-16 DIAGNOSIS — S32810S Multiple fractures of pelvis with stable disruption of pelvic ring, sequela: Secondary | ICD-10-CM

## 2016-11-16 DIAGNOSIS — S32402D Unspecified fracture of left acetabulum, subsequent encounter for fracture with routine healing: Secondary | ICD-10-CM

## 2016-11-16 DIAGNOSIS — S52502D Unspecified fracture of the lower end of left radius, subsequent encounter for closed fracture with routine healing: Secondary | ICD-10-CM | POA: Diagnosis not present

## 2016-11-16 LAB — CREATININE, SERUM: CREATININE: 0.58 mg/dL — AB (ref 0.61–1.24)

## 2016-11-16 LAB — BASIC METABOLIC PANEL
Anion gap: 9 (ref 5–15)
BUN: 14 mg/dL (ref 6–20)
CHLORIDE: 98 mmol/L — AB (ref 101–111)
CO2: 25 mmol/L (ref 22–32)
CREATININE: 0.53 mg/dL — AB (ref 0.61–1.24)
Calcium: 8.8 mg/dL — ABNORMAL LOW (ref 8.9–10.3)
GFR calc Af Amer: >60 mL/min (ref >60)
GFR calc non Af Amer: >60 mL/min (ref >60)
GLUCOSE: 120 mg/dL — AB (ref 65–99)
Potassium: 4.6 mmol/L (ref 3.5–5.1)
SODIUM: 132 mmol/L — AB (ref 135–145)

## 2016-11-16 LAB — MAGNESIUM: MAGNESIUM: 2.1 mg/dL (ref 1.7–2.4)

## 2016-11-16 LAB — CBC
HEMATOCRIT: 36.4 % — AB (ref 39.0–52.0)
Hemoglobin: 12.2 g/dL — ABNORMAL LOW (ref 13.0–17.0)
MCH: 28.6 pg (ref 26.0–34.0)
MCHC: 33.5 g/dL (ref 30.0–36.0)
MCV: 85.4 fL (ref 78.0–100.0)
Platelets: 452 10*3/uL — ABNORMAL HIGH (ref 150–400)
RBC: 4.26 MIL/uL (ref 4.22–5.81)
RDW: 14.3 % (ref 11.5–15.5)
WBC: 15.2 10*3/uL — AB (ref 4.0–10.5)

## 2016-11-16 LAB — GLUCOSE, CAPILLARY
Glucose-Capillary: 121 mg/dL — ABNORMAL HIGH (ref 65–99)
Glucose-Capillary: 124 mg/dL — ABNORMAL HIGH (ref 65–99)

## 2016-11-16 MED ORDER — ONDANSETRON HCL 4 MG/2ML IJ SOLN: 4.0000 mg | Freq: Four times a day (QID) | INTRAMUSCULAR | Status: DC | PRN

## 2016-11-16 MED ORDER — ONDANSETRON 4 MG PO TBDP
4.0000 mg | ORAL_TABLET | Freq: Four times a day (QID) | ORAL | Status: DC | PRN
  Filled 2016-11-16: qty 1

## 2016-11-16 MED ORDER — TRAVASOL 10 % IV SOLN
INTRAVENOUS | Status: DC
  Filled 2016-11-16: qty 547.2

## 2016-11-16 MED ORDER — PANTOPRAZOLE SODIUM 40 MG PO TBEC
40.0000 mg | DELAYED_RELEASE_TABLET | Freq: Every day | ORAL | Status: DC
  Administered 2016-11-17 – 2016-11-28 (×12): 40 mg via ORAL
  Filled 2016-11-16 (×12): qty 1

## 2016-11-16 MED ORDER — ENOXAPARIN SODIUM 40 MG/0.4ML ~~LOC~~ SOLN: 40.0000 mg | SUBCUTANEOUS | Status: DC

## 2016-11-16 MED ORDER — SODIUM CHLORIDE 0.9% FLUSH
10.0000 mL | INTRAVENOUS | Status: DC | PRN
  Administered 2016-11-18: 20 mL
  Administered 2016-11-18: 10 mL
  Filled 2016-11-16 (×2): qty 40

## 2016-11-16 MED ORDER — PANTOPRAZOLE SODIUM 40 MG IV SOLR
40.0000 mg | Freq: Every day | INTRAVENOUS | Status: DC
Start: 2016-11-16 — End: 2016-11-16

## 2016-11-16 MED ORDER — METOPROLOL TARTRATE 25 MG PO TABS
25.0000 mg | ORAL_TABLET | Freq: Two times a day (BID) | ORAL | Status: DC
  Administered 2016-11-16 – 2016-11-28 (×24): 25 mg via ORAL
  Filled 2016-11-16 (×24): qty 1

## 2016-11-16 MED ORDER — ENOXAPARIN SODIUM 40 MG/0.4ML ~~LOC~~ SOLN
40.0000 mg | SUBCUTANEOUS | Status: DC
  Administered 2016-11-17 – 2016-11-28 (×12): 40 mg via SUBCUTANEOUS
  Filled 2016-11-16 (×12): qty 0.4

## 2016-11-16 MED ORDER — POLYETHYLENE GLYCOL 3350 17 G PO PACK
17.0000 g | PACK | Freq: Two times a day (BID) | ORAL | Status: DC
  Administered 2016-11-16 – 2016-11-27 (×20): 17 g via ORAL
  Filled 2016-11-16 (×24): qty 1

## 2016-11-16 MED ORDER — ACETAMINOPHEN 325 MG PO TABS
650.0000 mg | ORAL_TABLET | Freq: Four times a day (QID) | ORAL | Status: DC
  Administered 2016-11-17 – 2016-11-28 (×29): 650 mg via ORAL
  Filled 2016-11-16 (×37): qty 2

## 2016-11-16 MED ORDER — METOCLOPRAMIDE HCL 10 MG PO TABS
5.0000 mg | ORAL_TABLET | Freq: Three times a day (TID) | ORAL | Status: DC
  Administered 2016-11-17: 5 mg via ORAL
  Filled 2016-11-16: qty 1

## 2016-11-16 MED ORDER — SODIUM CHLORIDE 0.9% FLUSH: 10.0000 mL | INTRAVENOUS | Status: DC | PRN

## 2016-11-16 MED ORDER — TRAVASOL 10 % IV SOLN
INTRAVENOUS | Status: AC
  Administered 2016-11-16: 18:00:00 via INTRAVENOUS
  Filled 2016-11-16: qty 547.2

## 2016-11-16 MED ORDER — BISACODYL 10 MG RE SUPP
10.0000 mg | Freq: Every day | RECTAL | Status: DC | PRN
  Administered 2016-11-21 – 2016-11-22 (×2): 10 mg via RECTAL
  Filled 2016-11-16 (×2): qty 1

## 2016-11-16 MED ORDER — ENSURE ENLIVE PO LIQD
237.0000 mL | Freq: Four times a day (QID) | ORAL | Status: DC
  Administered 2016-11-16 – 2016-11-28 (×33): 237 mL via ORAL

## 2016-11-16 MED ORDER — OXYCODONE HCL 5 MG PO TABS
5.0000 mg | ORAL_TABLET | ORAL | Status: DC | PRN
  Administered 2016-11-16: 5 mg via ORAL
  Administered 2016-11-16 – 2016-11-24 (×12): 10 mg via ORAL
  Administered 2016-11-25 – 2016-11-27 (×5): 5 mg via ORAL
  Filled 2016-11-16 (×5): qty 2
  Filled 2016-11-16 (×2): qty 1
  Filled 2016-11-16: qty 2
  Filled 2016-11-16: qty 1
  Filled 2016-11-16 (×3): qty 2
  Filled 2016-11-16: qty 1
  Filled 2016-11-16 (×2): qty 2
  Filled 2016-11-16: qty 1
  Filled 2016-11-16 (×3): qty 2
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
## 2016-11-16 NOTE — Progress Notes (Signed)
Report given to Evangeline GulaKaren RN from inpatient rehab.  Pt has no co pain.  No ss of any acute distress.

## 2016-11-16 NOTE — Progress Notes (Signed)
Pt transferred to [id number] at this time.  No ss of any acute distress.

## 2016-11-16 NOTE — Progress Notes (Signed)
Pt arrived to unit approximately 1600 with family at bedside.  Pt reported pain of 9 of 10 pain to abd. Pt alert and oriented x 4, able to make needs known, last bm per report was today and last pain medication was provided around 1300.  Pt unable to have pain medication and was repositioned for comfort. Call bell in reach.

## 2016-11-17 ENCOUNTER — Encounter (HOSPITAL_COMMUNITY): Payer: BLUE CROSS/BLUE SHIELD

## 2016-11-17 ENCOUNTER — Inpatient Hospital Stay (HOSPITAL_COMMUNITY): Payer: BLUE CROSS/BLUE SHIELD

## 2016-11-17 ENCOUNTER — Inpatient Hospital Stay (HOSPITAL_COMMUNITY): Payer: BLUE CROSS/BLUE SHIELD | Admitting: Occupational Therapy

## 2016-11-17 DIAGNOSIS — K567 Ileus, unspecified: Secondary | ICD-10-CM

## 2016-11-17 DIAGNOSIS — K9189 Other postprocedural complications and disorders of digestive system: Secondary | ICD-10-CM

## 2016-11-17 MED ORDER — METOCLOPRAMIDE HCL 10 MG PO TABS
10.0000 mg | ORAL_TABLET | Freq: Three times a day (TID) | ORAL | Status: DC
  Administered 2016-11-17 – 2016-11-26 (×23): 10 mg via ORAL
  Filled 2016-11-17 (×25): qty 1

## 2016-11-17 NOTE — Progress Notes (Signed)
Received call from IV team said that no new order for TPN and need to take down. Informed to cut down to 220ml/hr.  TPN infusing at 5020ml/hr.
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
## 2016-11-18 ENCOUNTER — Inpatient Hospital Stay (HOSPITAL_COMMUNITY): Payer: BLUE CROSS/BLUE SHIELD | Admitting: Occupational Therapy

## 2016-11-18 ENCOUNTER — Inpatient Hospital Stay (HOSPITAL_COMMUNITY): Payer: BLUE CROSS/BLUE SHIELD

## 2016-11-18 ENCOUNTER — Inpatient Hospital Stay (HOSPITAL_COMMUNITY): Payer: BLUE CROSS/BLUE SHIELD | Admitting: Physical Therapy

## 2016-11-18 DIAGNOSIS — R000 Tachycardia, unspecified: Secondary | ICD-10-CM

## 2016-11-18 DIAGNOSIS — R740 Nonspecific elevation of levels of transaminase and lactic acid dehydrogenase [LDH]: Secondary | ICD-10-CM

## 2016-11-18 DIAGNOSIS — K9189 Other postprocedural complications and disorders of digestive system: Secondary | ICD-10-CM

## 2016-11-18 DIAGNOSIS — D72829 Elevated white blood cell count, unspecified: Secondary | ICD-10-CM

## 2016-11-18 DIAGNOSIS — R748 Abnormal levels of other serum enzymes: Secondary | ICD-10-CM

## 2016-11-18 DIAGNOSIS — T07XXXA Unspecified multiple injuries, initial encounter: Secondary | ICD-10-CM

## 2016-11-18 DIAGNOSIS — M7989 Other specified soft tissue disorders: Secondary | ICD-10-CM

## 2016-11-18 DIAGNOSIS — E871 Hypo-osmolality and hyponatremia: Secondary | ICD-10-CM

## 2016-11-18 DIAGNOSIS — K567 Ileus, unspecified: Secondary | ICD-10-CM

## 2016-11-18 DIAGNOSIS — D62 Acute posthemorrhagic anemia: Secondary | ICD-10-CM

## 2016-11-18 LAB — COMPREHENSIVE METABOLIC PANEL
ALT: 118 U/L — ABNORMAL HIGH (ref 17–63)
ANION GAP: 8 (ref 5–15)
AST: 44 U/L — ABNORMAL HIGH (ref 15–41)
Albumin: 2.4 g/dL — ABNORMAL LOW (ref 3.5–5.0)
Alkaline Phosphatase: 296 U/L — ABNORMAL HIGH (ref 38–126)
BILIRUBIN TOTAL: 0.8 mg/dL (ref 0.3–1.2)
BUN: 12 mg/dL (ref 6–20)
CALCIUM: 8.5 mg/dL — AB (ref 8.9–10.3)
CO2: 26 mmol/L (ref 22–32)
Chloride: 100 mmol/L — ABNORMAL LOW (ref 101–111)
Creatinine, Ser: 0.59 mg/dL — ABNORMAL LOW (ref 0.61–1.24)
GFR calc Af Amer: >60 mL/min (ref >60)
Glucose, Bld: 102 mg/dL — ABNORMAL HIGH (ref 65–99)
POTASSIUM: 4.5 mmol/L (ref 3.5–5.1)
Sodium: 134 mmol/L — ABNORMAL LOW (ref 135–145)
TOTAL PROTEIN: 7 g/dL (ref 6.5–8.1)

## 2016-11-18 LAB — CBC WITH DIFFERENTIAL/PLATELET
BASOS PCT: 1 %
Basophils Absolute: 0.1 10*3/uL (ref 0.0–0.1)
EOS PCT: 1 %
Eosinophils Absolute: 0.1 10*3/uL (ref 0.0–0.7)
HEMATOCRIT: 34.4 % — AB (ref 39.0–52.0)
Hemoglobin: 11.2 g/dL — ABNORMAL LOW (ref 13.0–17.0)
LYMPHS ABS: 1.8 10*3/uL (ref 0.7–4.0)
Lymphocytes Relative: 14 %
MCH: 28 pg (ref 26.0–34.0)
MCHC: 32.6 g/dL (ref 30.0–36.0)
MCV: 86 fL (ref 78.0–100.0)
MONO ABS: 2.4 10*3/uL — AB (ref 0.1–1.0)
Monocytes Relative: 19 %
NEUTROS ABS: 8.1 10*3/uL — AB (ref 1.7–7.7)
Neutrophils Relative %: 65 %
Platelets: 413 10*3/uL — ABNORMAL HIGH (ref 150–400)
RBC: 4 MIL/uL — ABNORMAL LOW (ref 4.22–5.81)
RDW: 14.4 % (ref 11.5–15.5)
WBC: 12.5 10*3/uL — ABNORMAL HIGH (ref 4.0–10.5)

## 2016-11-18 LAB — GLUCOSE, CAPILLARY: GLUCOSE-CAPILLARY: 106 mg/dL — AB (ref 65–99)
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
## 2016-11-18 NOTE — Care Management Note (Signed)
[facility] Individual Statement of Services  Patient Name:  Evan Landry  Date:  11/18/2016  Welcome to the [facility].  Our goal is to provide you with an individualized program based on your diagnosis and situation, designed to meet your specific needs.  With this comprehensive rehabilitation program, you will be expected to participate in at least 3 hours of rehabilitation therapies Monday-Friday, with modified therapy programming on the weekends.  Your rehabilitation program will include the following services:  Physical Therapy (PT), Occupational Therapy (OT), 24 hour per day rehabilitation nursing, Neuropsychology, Case Management (Social Worker), Rehabilitation Medicine, Nutrition Services and Pharmacy Services  Weekly team conferences will be held on Wednesday to discuss your progress.  Your Social Worker will talk with you frequently to get your input and to update you on team discussions.  Team conferences with you and your family in attendance may also be held.  Expected length of stay: 14-16 days  Overall anticipated outcome: min-mod assist  Depending on your progress and recovery, your program may change. Your Social Worker will coordinate services and will keep you informed of any changes. Your Social Worker's name and contact numbers are listed  below.  The following services may also be recommended but are not provided by the [facility]:   Driving Evaluations  Home Health Rehabiltiation Services  Outpatient Rehabilitation Services  Vocational Rehabilitation   Arrangements will be made to provide these services after discharge if needed.  Arrangements include referral to agencies that provide these services.  Your insurance has been verified to be:  BCBS & med Pay Your primary doctor is:  None  Pertinent information will be shared with your doctor and your insurance company.  Social Worker:  Dossie DerBecky  Trenita Hulme, SW [phone] or [phone]  Information discussed with and copy given to patient by: Lucy Chrisupree, Maykayla Highley G, 11/18/2016, 2:12 PM

## 2016-11-18 NOTE — Progress Notes (Signed)
Physical Therapy Session Note  Patient Details  Name: Evan Landry MRN: [id number] Date of Birth: [birth year]  Today's Date: 11/18/2016 PT Individual Time: 1300-1340 PT Individual Time Calculation (min): 40 min   Short Term Goals: Week 1:  PT Short Term Goal 1 (Week 1): pt will roll R/L with supervision, use of bed featurs PT Short Term Goal 2 (Week 1): pt will move supine> sit with mod assist and use of bed features PT Short Term Goal 3 (Week 1): pt will transfer with slide board and assistance of 1 person  Skilled Therapeutic Interventions/Progress Updates:   Pt in w/c upon arrival and agreeable to therapy, requesting to use toilet for bowel movement. C/o pain in hips w/ movement during mobility, resolves w/ repositioning and rest. Set up transfer to drop-arm commode and completed Mod A x2 (wife assisting for UE/LE management), used beasy board for transfer. Spent 25-30 minutes toileting 2/2 difficulty w/ transfer and educated pt on importance of spending time OOB to increased tolerance to upright activity. Got pt roho cushion for next therapist to replace in w/c for pt's comfort as he believes it will help him to better tolerate sitting. Ended session in w/c and in care of wife, all needs met.   Therapy Documentation Precautions:  Precautions Precautions: Fall Required Braces or Orthoses: Other Brace/Splint Other Brace/Splint: Bilateral hands splints to be worn at all times with exception of ADLs/dressing changes Restrictions Weight Bearing Restrictions: Yes RUE Weight Bearing: Weight bear through elbow only LUE Weight Bearing: Weight bear through elbow only RLE Weight Bearing: Non weight bearing LLE Weight Bearing: Non weight bearing  See Function Navigator for Current Functional Status.   Therapy/Group: Individual Therapy  Josilyn Shippee K Arnette 11/18/2016, 1:42 PM

## 2016-11-18 NOTE — Progress Notes (Signed)
LE venous duplex  has been completed. Preliminary results can be found: Chart review -> CV Proc  Tashawn Laswell Eunice, RDMS, RVT   
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
## 2016-11-18 NOTE — Progress Notes (Signed)
Physical Therapy Note  Patient Details  Name: Evan Landry MRN: [id number] Date of Birth: 03/24/1985 Today's Date: 11/18/2016  0900-1000, 60 min individual tx Pain: 2/10 abdomen, premedicated  Using bed features, supine> sit without rail, max assist.  Bed> w/c to R using Beezy board, +2 as pt started sliding forward.  Mother assisted.    A-P transfer w/c>< level mat table, suing Maxi Slide.  Pt moved forward with mod assist.  Pt returned to w/c with +2 due to difficulty moving backwards.  neuromuscular re-education via demo, multimodal cues and positioning, for 20 x 1 bil hip internal rotation, and bil glut sets to facilitate transfers.  Sustained stretch bil hamstrings and heel cords in long sitting, PT supporting his back, x 5 min.  Pt left resting in w/c with all needs within reach, wife and mother present. PT reiterated falls precautions and need to use call bell if pt must get back to bed before next therapy at 10:30.  Pt and family agreed.  See function navigator for current status.  Kambra Beachem 11/18/2016, 7:55 AM

## 2016-11-18 NOTE — Progress Notes (Signed)
Occupational Therapy Session Note  Patient Details  Name: Evan Landry MRN: [id number] Date of Birth: 1985-02-24  Today's Date: 11/18/2016 OT Individual Time: 9562-1308 OT Individual Time Calculation (min): 60 min    Short Term Goals: Week 1:  OT Short Term Goal 1 (Week 1): Pt will complete toilet transfer with 1 helper and LRAD OT Short Term Goal 2 (Week 1): Pt will don shirt with Mod A OT Short Term Goal 3 (Week 1): Pt will complete 1/3 components of donning pants   Skilled Therapeutic Interventions/Progress Updates:    Pt seen in room for ADL training with his wife and mother actively involved.  Pt urgently had to toilet.  Family wanted to have pt sit on toilet or BSC and explained that it was not stable enough to use the Breezy slide board to those surfaces.  He needed to go urgently and time did not allow for [facility]   Pt used Breezy board to bed max A, supine >< sit mod A with HOB elevated and rolling min A using rails. Pt used rolling to position in sidelying to have a BM.  His wife actively assisted with clean up.   Pt moved into supine with HOB elevated to actively engage in self bathing and UB dressing. He is not able to actively move the legs without significant discomfort.  When B.D completed tried to have pt work on and A/P transfer to w/c but he felt too "stuck" by the soft mattress and could not scoot hips.  Instead used board again with wife assisting also.  Pt adjusted in w/c with all needs met.  Therapy Documentation Precautions:  Precautions Precautions: Fall Required Braces or Orthoses: Other Brace/Splint Other Brace/Splint: Bilateral hands splints to be worn at all times with exception of ADLs/dressing changes Restrictions Weight Bearing Restrictions: Yes RUE Weight Bearing: Weight bear through elbow only LUE Weight Bearing: Weight bear through elbow only RLE Weight Bearing: Non weight bearing LLE Weight Bearing: Non weight bearing  Pain:  sensitivity to  touch on hips and thighs ADL: ADL ADL Comments: Please see functional navigator for ADL status See Function Navigator for Current Functional Status.   Therapy/Group: Individual Therapy  [place] 11/18/2016, 2:44 PM

## 2016-11-18 NOTE — Progress Notes (Signed)
Patient information reviewed and entered into eRehab system by Dynasty Holquin, RN, CRRN, PPS Coordinator.  Information including medical coding and functional independence measure will be reviewed and updated through discharge.    

## 2016-11-18 NOTE — Progress Notes (Signed)
Physical Therapy Session Note  Patient Details  Name: Mercie Eonhomas E Markwell MRN: [id number] Date of Birth: 07/20/1985  Today's Date: 11/18/2016 PT Individual Time: 1400-1430 PT Individual Time Calculation (min): 30 min   Short Term Goals: Week 1:  PT Short Term Goal 1 (Week 1): pt will roll R/L with supervision, use of bed featurs PT Short Term Goal 2 (Week 1): pt will move supine> sit with mod assist and use of bed features PT Short Term Goal 3 (Week 1): pt will transfer with slide board and assistance of 1 person  Skilled Therapeutic Interventions/Progress Updates: Pt received seated in w/c, c/o pain 3/10 in pelvis and LLE and agreeable to treatment. Performed BLE strengthening exercises hip flexion marching, hip adduction isometric, long arc quads 2x10 reps each. Pt's wife expressing concerns about pt being ready to go home by estimated length of stay of 2-2.5 weeks, reports she is "fully disabled and shouldn't be lifting but I do it anyway because I have to", reports pt's mother and father cannot provide much physical assist either. Reviewed goals set at S bed mobility, minA transfers for w/c <>bed and modA car transfers. Assured family that pt will make progress while on rehab, improve with transfers and work towards LTGs, with date set to appropriately reflect speed of progress towards goals. Also discussed plan to practice in/out of their family car closer to d/c to allow family to be prepared and feel more comfortable with transfers before they go home. Transferred pt back to bed with A/P maxA from therapist, +2 to stabilize w/c as pt scooted. Remained supine in bed at end of session, all needs in reach.      Therapy Documentation Precautions:  Precautions Precautions: Fall Required Braces or Orthoses: Other Brace/Splint Other Brace/Splint: Bilateral hands splints to be worn at all times with exception of ADLs/dressing changes Restrictions Weight Bearing Restrictions: Yes RUE Weight  Bearing: Weight bear through elbow only LUE Weight Bearing: Weight bear through elbow only RLE Weight Bearing: Non weight bearing LLE Weight Bearing: Non weight bearing   See Function Navigator for Current Functional Status.   Therapy/Group: Individual Therapy  Vista Lawmanlizabeth J Tygielski 11/18/2016, 2:58 PM

## 2016-11-19 ENCOUNTER — Inpatient Hospital Stay (HOSPITAL_COMMUNITY): Payer: BLUE CROSS/BLUE SHIELD | Admitting: Physical Therapy

## 2016-11-19 ENCOUNTER — Inpatient Hospital Stay (HOSPITAL_COMMUNITY): Payer: BLUE CROSS/BLUE SHIELD

## 2016-11-19 ENCOUNTER — Inpatient Hospital Stay (HOSPITAL_COMMUNITY): Payer: BLUE CROSS/BLUE SHIELD | Admitting: Occupational Therapy

## 2016-11-19 DIAGNOSIS — M62838 Other muscle spasm: Secondary | ICD-10-CM

## 2016-11-19 DIAGNOSIS — G8918 Other acute postprocedural pain: Secondary | ICD-10-CM

## 2016-11-19 MED ORDER — METHOCARBAMOL 500 MG PO TABS
500.0000 mg | ORAL_TABLET | Freq: Four times a day (QID) | ORAL | Status: DC | PRN
  Administered 2016-11-19: 500 mg via ORAL
  Filled 2016-11-19: qty 1

## 2016-11-19 NOTE — Progress Notes (Signed)
Patient's wounds look good without any signs of infection or erythema.  Patient only taking pain medication on PRN status & not frequently.  He takes more Tylenol than Narcotic.  Sutures are removed from Bilateral wounds.  Patient can wash with soap & water now.  Pin sites need to be cleaned daily with alcohol or soap & water.  Splints need to be worn when moving.  Patient to come to office when discharged to see Dr Merlyn LotKuzma & Dr Mina MarbleWeingold.

## 2016-11-19 NOTE — Progress Notes (Signed)
Instructed pt on procedure. HOB less than 45*. Pt held breath with line removal. Pressure held for 5 min, no s/sx of bleeding noted. Pressure drsg applied and pt was instructed to notify nurse if noting any s/sx of bleeding. Pt instructed to remain in bed for 30min, leave pressure drsg CDI for 24 hours. Pt VU. Evan MorrowHeather Jonai Weyland, RN VAST

## 2016-11-19 NOTE — Progress Notes (Signed)
Physical Therapy Session Note  Patient Details  Name: Evan Landry MRN: [id number] Date of Birth: Mar 27, 1985  Today's Date: 11/19/2016 PT Individual Time: 0950-1110 PT Individual Time Calculation (min): 80 min   Short Term Goals: Week 1:  PT Short Term Goal 1 (Week 1): pt will roll R/L with supervision, use of bed featurs PT Short Term Goal 2 (Week 1): pt will move supine> sit with mod assist and use of bed features PT Short Term Goal 3 (Week 1): pt will transfer with slide board and assistance of 1 person  Skilled Therapeutic Interventions/Progress Updates:   Pt supine upon arrival and agreeable to therapy, c/o stomach pain - 3-4/10, denied needing to toilet. Total A to don LE garments 2/2 pain. Transferred to EOB w/ Mod A and to w/c w/ Mod-Max A using beasy board. Pt requires Mod A to be placed on board prior to transfer and hooked arms on therapists shoulders to eliminate chances of using hands for support and therapist slid pt w/ Mod-Max A facilitation at trunk. Pt declined AP transfer today stating "it's too hard and painful". Pt readjusted in chair independently w/ elbows. Total A w/c transport to/from gym. Transferred to/from mat using same technique above and performed trunk and LE strengthening exercises as listed below. Pt required frequent rest breaks 2/2 surgical pain in pelvis and abdominal pain. Pt educated on importance of LE strengthening within WB precautions outside of therapy, pt motivated to get stronger to ease burden of care on family members. Returned to room in w/c and transferred back to EOB and then supine. Max A to scoot up in bed. Ended session in supine and in care of mother, all needs met.   Trunk and BLE strengthening exercises: -quad set, 2x10  -ankle pumps, 2x20 -heel slides, 2x10 (L assisted and in shortened range)  -SAQs, 2x10 -isometric abduction, 1x10  -LAQs, 1x10 -supine to prop on elbows, 1x5  Therapy Documentation Precautions:   Precautions Precautions: Fall Required Braces or Orthoses: Other Brace/Splint Other Brace/Splint: Bilateral hands splints to be worn at all times with exception of ADLs/dressing changes Restrictions Weight Bearing Restrictions: Yes RUE Weight Bearing: Weight bear through elbow only LUE Weight Bearing: Weight bear through elbow only RLE Weight Bearing: Non weight bearing LLE Weight Bearing: Non weight bearing  See Function Navigator for Current Functional Status.   Therapy/Group: Individual Therapy  Desarie Feild K Arnette 11/19/2016, 11:15 AM
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
## 2016-11-19 NOTE — Progress Notes (Signed)
Occupational Therapy Session Note  Patient Details  Name: Evan Landry MRN: [id number] Date of Birth: 11/27/1985  Today's Date: 11/19/2016 OT Individual Time: 1345-1445 OT Individual Time Calculation (min): 60 min    Short Term Goals: Week 1:  OT Short Term Goal 1 (Week 1): Pt will complete toilet transfer with 1 helper and LRAD OT Short Term Goal 2 (Week 1): Pt will don shirt with Mod A OT Short Term Goal 3 (Week 1): Pt will complete 1/3 components of donning pants   Skilled Therapeutic Interventions/Progress Updates:    Pt completed bathing and dressing supine to HOB up during session.  He was able to complete all UB bathing and dressing with increased time and supervision.  For LB bathing he was able to wash his upper legs and peri area.  Therapist completed washing below the knee secondary to not having a LH sponge.  Therapist donned pants over feet and then had pt roll side to side in supine with min assist in order for therapist to provide mod assist for pulling pants over hips.  Therapist assisted with TEDs and gripper socks to complete session.  Pt left in bed with PT coming to assist with transfer OOB.    Therapy Documentation Precautions:  Precautions Precautions: Fall Required Braces or Orthoses: Other Brace/Splint Other Brace/Splint: Bilateral hands splints to be worn at all times with exception of ADLs/dressing changes Restrictions Weight Bearing Restrictions: Yes RUE Weight Bearing: Weight bear through elbow only LUE Weight Bearing: Weight bear through elbow only RLE Weight Bearing: Non weight bearing LLE Weight Bearing: Non weight bearing  Pain: Pain Assessment Pain Assessment: Faces Faces Pain Scale: Hurts a little bit Pain Type: Surgical pain Pain Location: Leg Pain Orientation: Right;Left Pain Descriptors / Indicators: Discomfort Pain Onset: With Activity Pain Intervention(s): Repositioned ADL: See Function Navigator for Current Functional  Status.   Therapy/Group: Individual Therapy  Juleah Paradise OTR/L 11/19/2016, 4:23 PM

## 2016-11-19 NOTE — Progress Notes (Signed)
Physical Therapy Session Note  Patient Details  Name: Evan Landry MRN: [id number] Date of Birth: 06/23/1985  Today's Date: 11/19/2016 PT Individual Time: 1448-1530 PT Individual Time Calculation (min): 42 min   Short Term Goals: Week 1:  PT Short Term Goal 1 (Week 1): pt will roll R/L with supervision, use of bed featurs PT Short Term Goal 2 (Week 1): pt will move supine> sit with mod assist and use of bed features PT Short Term Goal 3 (Week 1): pt will transfer with slide board and assistance of 1 person  Skilled Therapeutic Interventions/Progress Updates:    Pt supine in bed upon PT arrival, agreeable to therapy tx and reports pain 3/10. Pt transferred from supine to sitting EOB with mod assist to bring LEs off bed, HOB elevated. Pt seated EOB performed core strengthening exercises moving from sitting<>sidelying on elbow x 10 each side. Pt performed 2 x 10 LAQ while sitting EOB for LE strengthening. Pt transferred from sitting EOB >sidelying on R elbow>sidelying>supine with mod assist to lift LEs. In supine pt performed 2 x 10 crunches for core strengthening, unable to fully clean scapulae. Pt transferred from supine<>supine on elbows x 10 for core strengthening and working on bed mobility. In supine pt performed heel slides and SLRs bilatterally active assisted 2 x 10. Pt transferred from supine>supine propped on elbows>longsitting with min assist and then pivoted hips to sitting EOB while therapist helped move LEs around. Pt left seated EOB with mother present, needs in reach.   Therapy Documentation Precautions:  Precautions Precautions: Fall Required Braces or Orthoses: Other Brace/Splint Other Brace/Splint: Bilateral hands splints to be worn at all times with exception of ADLs/dressing changes Restrictions Weight Bearing Restrictions: Yes RUE Weight Bearing: Weight bear through elbow only LUE Weight Bearing: Weight bear through elbow only RLE Weight Bearing: Non weight  bearing LLE Weight Bearing: Non weight bearing   See Function Navigator for Current Functional Status.   Therapy/Group: Individual Therapy  Mindi Curlingmily van Schagen, Pt, DPT 11/19/2016, 3:08 PM

## 2016-11-20 ENCOUNTER — Inpatient Hospital Stay (HOSPITAL_COMMUNITY): Payer: BLUE CROSS/BLUE SHIELD

## 2016-11-20 ENCOUNTER — Inpatient Hospital Stay (HOSPITAL_COMMUNITY): Payer: BLUE CROSS/BLUE SHIELD | Admitting: Occupational Therapy

## 2016-11-20 MED ORDER — METHOCARBAMOL 500 MG PO TABS
1000.0000 mg | ORAL_TABLET | Freq: Four times a day (QID) | ORAL | Status: DC | PRN
  Administered 2016-11-20 – 2016-11-21 (×3): 1000 mg via ORAL
  Filled 2016-11-20 (×3): qty 2

## 2016-11-20 NOTE — Progress Notes (Signed)
Occupational Therapy Session Note  Patient Details  Name: Evan Landry MRN: [id number] Date of Birth: 02/19/1985  Today's Date: 11/20/2016 OT Individual Time: 1003-1105 OT Individual Time Calculation (min): 62 min    Short Term Goals: Week 1:  OT Short Term Goal 1 (Week 1): Pt will complete toilet transfer with 1 helper and LRAD OT Short Term Goal 2 (Week 1): Pt will don shirt with Mod A OT Short Term Goal 3 (Week 1): Pt will complete 1/3 components of donning pants   Skilled Therapeutic Interventions/Progress Updates:    Pt completed bathing and dressing sitting on EOB.  He was able to maintain static sitting balance for UB bathing with supervision.  Pt self manages donning and doffing bilateral hand splints as well for bathing tasks.  Provided LH sponge and reacher for pt to use with doffing and donning LB clothing as well as for washing lower legs and feet.  He was able to use the reacher with supervision to doff bilateral gripper socks and then donn his pants over his feet.  Max assist for washing peri area and pulling pants over hips with lateral leans side to side.  Total assist needed for TEDs and for donning gripper socks.  Max assist for [employer] board transfer from the bed to the wheelchair.  Pt left with call button and phone in reach and safety belt in place.    Therapy Documentation Precautions:  Precautions Precautions: Fall Required Braces or Orthoses: Other Brace/Splint Other Brace/Splint: Bilateral hands splints to be worn at all times with exception of ADLs/dressing changes Restrictions Weight Bearing Restrictions: Yes RUE Weight Bearing: Weight bear through elbow only LUE Weight Bearing: Weight bear through elbow only RLE Weight Bearing: Non weight bearing LLE Weight Bearing: Non weight bearing   Pain: Pain Assessment Pain Assessment: Faces Pain Score: 0-No pain Faces Pain Scale: Hurts little more Pain Type: Surgical pain Pain Location: Leg Pain Orientation:  Right;Left Pain Descriptors / Indicators: Discomfort Pain Onset: With Activity Pain Intervention(s): Repositioned ADL: See Function Navigator for Current Functional Status.   Therapy/Group: Individual Therapy  Hansen Carino OTR/L 11/20/2016, 12:12 PM

## 2016-11-20 NOTE — Progress Notes (Signed)
Occupational Therapy Session Note  Patient Details  Name: Evan Landry MRN: [id number] Date of Birth: 03/01/1985  Today's Date: 11/20/2016 OT Individual Time: [id number] OT Individual Time Calculation (min): 23 min    Short Term Goals: Week 1:  OT Short Term Goal 1 (Week 1): Pt will complete toilet transfer with 1 helper and LRAD OT Short Term Goal 2 (Week 1): Pt will don shirt with Mod A OT Short Term Goal 3 (Week 1): Pt will complete 1/3 components of donning pants   Skilled Therapeutic Interventions/Progress Updates:    Pt worked on scooting transfers to the wide drop arm commode.  He needed mod assist for transition to sitting from supine, HOB elevated 25 degrees.  He needed max assist for positioning of the sliding board and then mod assist to scoot across the board.  He completed simulated clothing management with mod assist with lateral leans side to side.  Once completed he transferred back to the bed with max assist, slightly uphill, using the sliding board.  Finished session with pt sitting EOB to work on lunch.  Nursing made aware as well.    Therapy Documentation Precautions:  Precautions Precautions: Fall Required Braces or Orthoses: Other Brace/Splint Other Brace/Splint: Bilateral hands splints to be worn at all times with exception of ADLs/dressing changes Restrictions Weight Bearing Restrictions: Yes RUE Weight Bearing: Weight bear through elbow only LUE Weight Bearing: Weight bear through elbow only RLE Weight Bearing: Non weight bearing LLE Weight Bearing: Non weight bearing Pain: Pain Assessment Pain Assessment: Faces Faces Pain Scale: Hurts little more Pain Type: Surgical pain Pain Location: Leg Pain Orientation: Right;Left Pain Descriptors / Indicators: Discomfort Pain Onset: With Activity Pain Intervention(s): Repositioned ADL: See Function Navigator for Current Functional Status.   Therapy/Group: Individual Therapy  Jemeka Wagler  OTR/L 11/20/2016, 2:16 PM

## 2016-11-20 NOTE — Progress Notes (Signed)
Social Work Patient ID: Evan Landry, male   DOB: 09-03-1985, 31 y.o.   MRN: [id number]  Met with pt and wife, Mom on speaker phone to inform team conference goals supervision-mod assist level due to WB and limitations. Both are pleased with how he is doing and will assist at discharge. Both hopeful the x-ray will be better and he will be able to WB on right leg to transfer only. Will see tomorrow. Aware target discharge is 11/8. Work on discharge needs.

## 2016-11-20 NOTE — Progress Notes (Signed)
Physical Therapy Session Note  Patient Details  Name: Evan Landry MRN: [id number] Date of Birth: 07/06/1985  Today's Date: 11/20/2016 PT Individual Time: [id number], [id number] PT Individual Time Calculation (min): 43 min, 54 min  Short Term Goals: Week 1:  PT Short Term Goal 1 (Week 1): pt will roll R/L with supervision, use of bed featurs PT Short Term Goal 2 (Week 1): pt will move supine> sit with mod assist and use of bed features PT Short Term Goal 3 (Week 1): pt will transfer with slide board and assistance of 1 person  Skilled Therapeutic Interventions/Progress Updates:    Session 1: Pt seated in w/c upon PT arrival, agreeable to therapy tx and reports pain 6/10 in pelvic region. Pt requested to brush teeth befor heading to the gym. Pt brushed teeth at the sink with supervision. Pt transported from room<>gym in w/c total assist. Transferred from w/c <>mat w/ Mod-Max A using beasy board. Pt requires Mod A to be placed on board prior to transfer and hooked arms on therapists shoulders to eliminate chances of using hands for support and therapist slid pt w/ Mod-Max A facilitation at trunk. Seated edge of mat pt performed 2 x 10 LAQ, 2 x 10 hip abduction against manual resistance, 1 x 10 hip adduction with manual resistance and 1 x 10 hip flexion. Seated edge of mat pt worked on core strengthening: performed 2 x 10 sitting<>sidelying on elbow, leaning in all directions and coming back to midline. Pt tranported back to room in w/c. Pt transferred from w/c > bed w/ Mod A using beasy board. Pt transferred sitting>supine with mod assist, left supine in bed with needs in reach.  Session 2:  Pt supine in bed upon PT arrival, agreeable to therapy tx but reports feeling uncomfortable and abdominal pain 6/10 with cramping. Pt performed supine therex: heel slides AAROM 2 x 10, hip add/abduction AAROM 2 x 10, quad sets 2 x 10 and SAQ 2 x 10. Pt reporting wanting to try to have a BM. Pt transferred from  supine to sitting EOB with mod assist to bring LEs off the bed and assist in trunk flexion. Pt transferred from bed<>bedside commode with max assist using the slideboard while maintaining all NWB precautions, mod assist to help doff/don pants while leaning on elbows to unweight hips x 2 each side. Pt total assist to clean peri area.  Pt transferred back to bed and from sitting>supine with mod assist. Pt left supine in bed with needs in reach. Wife present for entire session, performed hands on practice for family ed to clean pt and transfer from commode back to bed.      Therapy Documentation Precautions:  Precautions Precautions: Fall Required Braces or Orthoses: Other Brace/Splint Other Brace/Splint: Bilateral hands splints to be worn at all times with exception of ADLs/dressing changes Restrictions Weight Bearing Restrictions: Yes RUE Weight Bearing: Weight bear through elbow only LUE Weight Bearing: Weight bear through elbow only RLE Weight Bearing: Non weight bearing LLE Weight Bearing: Non weight bearing   See Function Navigator for Current Functional Status.   Therapy/Group: Individual Therapy  Cresenciano GenreEmily van Schagen, PT, DPT 11/20/2016, 11:37 AM
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
## 2016-11-21 ENCOUNTER — Inpatient Hospital Stay (HOSPITAL_COMMUNITY): Payer: BLUE CROSS/BLUE SHIELD | Admitting: Physical Therapy

## 2016-11-21 ENCOUNTER — Inpatient Hospital Stay (HOSPITAL_COMMUNITY): Payer: BLUE CROSS/BLUE SHIELD | Admitting: Occupational Therapy

## 2016-11-21 DIAGNOSIS — T148XXA Other injury of unspecified body region, initial encounter: Secondary | ICD-10-CM

## 2016-11-21 MED ORDER — BACLOFEN 10 MG PO TABS
10.0000 mg | ORAL_TABLET | Freq: Three times a day (TID) | ORAL | Status: DC | PRN
  Administered 2016-11-22 – 2016-11-28 (×9): 10 mg via ORAL
  Filled 2016-11-21 (×9): qty 1

## 2016-11-21 NOTE — Progress Notes (Signed)
Physical Therapy Session Note  Patient Details  Name: Evan Landry MRN: [id number] Date of Birth: 06/24/1985  Today's Date: 11/21/2016 PT Individual Time: 1005-1045 AND 1600-1655 PT Individual Time Calculation (min): 40 min   Short Term Goals: Week 1:  PT Short Term Goal 1 (Week 1): pt will roll R/L with supervision, use of bed featurs PT Short Term Goal 2 (Week 1): pt will move supine> sit with mod assist and use of bed features PT Short Term Goal 3 (Week 1): pt will transfer with slide board and assistance of 1 person  Skilled Therapeutic Interventions/Progress Updates:   Session 1:  Pt received in w/c w/ OT and agreeable to therapy, no c/o pain. Worked on LE strengthening exercises as listed below, performed while seated in w/c. He required decreased rest breaks this session. Discussed performing family education this weekend on Saturday when wife, son-in-law, and mother can all be present. Transferred via lateral scoot to recliner w/ armrests down. Educated pt and family on importance of upright activity to increase tolerance to OOB activity, all in agreement. Ended session in recliner, call bell within reach and all needs met.   BLE strengthening exercises: -LAQs 2x10  -heel slides 2x10  -isometric abduction 2x10  -isometric adduction 2x10  -knee marches in shortened range 2x10  -ankle pumps 2x10   Session 2:  Pt supine upon arrival and agreeable to therapy, no c/o pain. Transferred to EOB w/ supervision and to w/c via slide board transfer to w/c. Total A w/c mobility to/from gym. Worked on LE and trunk strengthening exercises in supine and seated on mat as listed below. Returned to room and ended session in supine and in care of family, all needs met.   BLE and trunk strengthening exercises:  -SAQs 2x15  -quad sets 2x20  -supine to propped on elbows 1x5  -side crunches to elbows bilaterally 2x5   Therapy Documentation Precautions:  Precautions Precautions:  Fall Required Braces or Orthoses: Other Brace/Splint Other Brace/Splint: Bilateral hands splints to be worn at all times with exception of ADLs/dressing changes Restrictions Weight Bearing Restrictions: Yes RUE Weight Bearing: Weight bear through elbow only LUE Weight Bearing: Weight bear through elbow only RLE Weight Bearing: Non weight bearing LLE Weight Bearing: Non weight bearing Pain: Pain Assessment Pain Assessment: No/denies pain Pain Score: 0-No pain  See Function Navigator for Current Functional Status.   Therapy/Group: Individual Therapy  Lyndle Pang K Arnette 11/21/2016, 10:56 AM

## 2016-11-21 NOTE — Progress Notes (Signed)
Dulcolax  Supp. Given this AM per patients request. Evan Landry, Evan Landry

## 2016-11-21 NOTE — Progress Notes (Signed)
Occupational Therapy Session Note  Patient Details  Name: Evan Landry MRN: [id number] Date of Birth: 02/03/1985  Today's Date: 11/21/2016 OT Individual Time: 0900-1000 OT Individual Time Calculation (min): 60 min    Short Term Goals: Week 1:  OT Short Term Goal 1 (Week 1): Pt will complete toilet transfer with 1 helper and LRAD OT Short Term Goal 2 (Week 1): Pt will don shirt with Mod A OT Short Term Goal 3 (Week 1): Pt will complete 1/3 components of donning pants   Skilled Therapeutic Interventions/Progress Updates:    Pt completed bathing and dressing sitting on the EOB.  Wife had assist pt with supine to sit as therapist was entering secondary to not being able to move his LEs off of the bed without assist and needing assist with transition from sidelying to sitting.  He completed UB bathing and dressing with supervision.  Needed to toilet during bathing, so had pt complete transfer to the drop arm commode with max assist using the [facility] board.  He needed max assist for toilet hygiene in sitting with lateral leans as well as for clothing management.  Introduced sockaide to pt this session.  He was able to donn Bilateral socks once they were placed on the sockaide.  Transferred with regular sliding board to the wheelchair with mod assist to conclude session.  Pt's spouse assisted with clothing management as well on toilet secondary to being an experienced CNA.  Pt left with PT for next session.   Therapy Documentation Precautions:  Precautions Precautions: Fall Required Braces or Orthoses: Other Brace/Splint Other Brace/Splint: Bilateral hands splints to be worn at all times with exception of ADLs/dressing changes Restrictions Weight Bearing Restrictions: Yes RUE Weight Bearing: Weight bear through elbow only LUE Weight Bearing: Weight bear through elbow only RLE Weight Bearing: Non weight bearing LLE Weight Bearing: Non weight bearing   Pain: Pain Assessment Pain Assessment:  Faces Faces Pain Scale: Hurts a little bit Pain Type: Surgical pain Pain Location: Pelvis Pain Orientation: Right;Left Pain Descriptors / Indicators: Discomfort;Grimacing Pain Onset: With Activity Pain Intervention(s): Repositioned ADL: See Function Navigator for Current Functional Status.   Therapy/Group: Individual Therapy  Evan Landry OTR/L 11/21/2016, 12:31 PM

## 2016-11-21 NOTE — Progress Notes (Signed)
PRN robaxin and Oxy ir given at HS per patient's request. Scheduled tylenol held per patient request, if asleep. Complains of numbness to bilateral thighs, heat packs applied. Mild abdominal distention.Evan MartinezMurray, Evan Landry
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
## 2016-11-21 NOTE — Progress Notes (Signed)
Occupational Therapy Session Note  Patient Details  Name: Evan Landry MRN: [id number] Date of Birth: 11/07/1985  Today's Date: 11/21/2016 OT Individual Time: 1302-1330 OT Individual Time Calculation (min): 28 min    Short Term Goals: Week 1:  OT Short Term Goal 1 (Week 1): Pt will complete toilet transfer with 1 helper and LRAD OT Short Term Goal 2 (Week 1): Pt will don shirt with Mod A OT Short Term Goal 3 (Week 1): Pt will complete 1/3 components of donning pants   Skilled Therapeutic Interventions/Progress Updates:    Pt worked on supine to sit EOB to start session.  He was able to complete with min assist with HOB up 30 degrees.  This session he could advance his LEs off of the EOB with supervision, which was a big improvement from this morning and yesterdays session.  Completed transfer to the wheelchair with mod assist after sliding board was positioned.  He completed donning sock on sockaide with increased time and then donned over his foot with supervision and increased time as well.  Issued foam block for use on gross grasp strengthening bilaterally and tip to tip pinch using the RUE.  Pt able to return demonstrate and can oppose the thumb to digits 3 and 4 of the right hand but not quite the 5th.  Returned to room at conclusion of session with pt completing sliding board transfer back to the EOB with mod assist in order to work on eating lunch.  Family present in room and call button and phone in reach.    Therapy Documentation Precautions:  Precautions Precautions: Fall Required Braces or Orthoses: Other Brace/Splint Other Brace/Splint: Bilateral hands splints to be worn at all times with exception of ADLs/dressing changes Restrictions Weight Bearing Restrictions: Yes RUE Weight Bearing: Weight bear through elbow only LUE Weight Bearing: Weight bear through elbow only RLE Weight Bearing: Non weight bearing LLE Weight Bearing: Non weight bearing   Pain: Pain  Assessment Pain Assessment: Faces Faces Pain Scale: Hurts a little bit Pain Type: Surgical pain Pain Location: Leg ADL: See Function Navigator for Current Functional Status.   Therapy/Group: Individual Therapy  Sekai Nayak OTR/L 11/21/2016, 3:15 PM

## 2016-11-22 ENCOUNTER — Inpatient Hospital Stay (HOSPITAL_COMMUNITY): Payer: BLUE CROSS/BLUE SHIELD | Admitting: Occupational Therapy

## 2016-11-22 ENCOUNTER — Inpatient Hospital Stay (HOSPITAL_COMMUNITY): Payer: BLUE CROSS/BLUE SHIELD | Admitting: Physical Therapy

## 2016-11-22 LAB — CBC WITH DIFFERENTIAL/PLATELET
BASOS ABS: 0.1 10*3/uL (ref 0.0–0.1)
BASOS PCT: 1 %
Eosinophils Absolute: 0.2 10*3/uL (ref 0.0–0.7)
Eosinophils Relative: 2 %
HEMATOCRIT: 35.4 % — AB (ref 39.0–52.0)
HEMOGLOBIN: 11.5 g/dL — AB (ref 13.0–17.0)
LYMPHS PCT: 15 %
Lymphs Abs: 1.7 10*3/uL (ref 0.7–4.0)
MCH: 27.8 pg (ref 26.0–34.0)
MCHC: 32.5 g/dL (ref 30.0–36.0)
MCV: 85.7 fL (ref 78.0–100.0)
MONOS PCT: 11 %
Monocytes Absolute: 1.3 10*3/uL — ABNORMAL HIGH (ref 0.1–1.0)
NEUTROS ABS: 8.2 10*3/uL — AB (ref 1.7–7.7)
NEUTROS PCT: 71 %
Platelets: 392 10*3/uL (ref 150–400)
RBC: 4.13 MIL/uL — ABNORMAL LOW (ref 4.22–5.81)
RDW: 14.2 % (ref 11.5–15.5)
WBC: 11.5 10*3/uL — ABNORMAL HIGH (ref 4.0–10.5)

## 2016-11-22 LAB — COMPREHENSIVE METABOLIC PANEL
ALBUMIN: 2.7 g/dL — AB (ref 3.5–5.0)
ALK PHOS: 320 U/L — AB (ref 38–126)
ALT: 143 U/L — AB (ref 17–63)
ANION GAP: 10 (ref 5–15)
AST: 51 U/L — ABNORMAL HIGH (ref 15–41)
BILIRUBIN TOTAL: 0.9 mg/dL (ref 0.3–1.2)
BUN: 10 mg/dL (ref 6–20)
CALCIUM: 8.9 mg/dL (ref 8.9–10.3)
CO2: 26 mmol/L (ref 22–32)
CREATININE: 0.61 mg/dL (ref 0.61–1.24)
Chloride: 102 mmol/L (ref 101–111)
GFR calc Af Amer: >60 mL/min (ref >60)
GFR calc non Af Amer: >60 mL/min (ref >60)
GLUCOSE: 105 mg/dL — AB (ref 65–99)
Potassium: 4.3 mmol/L (ref 3.5–5.1)
Sodium: 138 mmol/L (ref 135–145)
TOTAL PROTEIN: 7.7 g/dL (ref 6.5–8.1)

## 2016-11-22 NOTE — Progress Notes (Signed)
Occupational Therapy Session Note  Patient Details  Name: Mercie Eonhomas E Klonowski MRN: [id number] Date of Birth: 05/17/1985  Today's Date: 11/22/2016 OT Individual Time: 1000-1058 OT Individual Time Calculation (min): 58 min    Short Term Goals: Week 1:  OT Short Term Goal 1 (Week 1): Pt will complete toilet transfer with 1 helper and LRAD OT Short Term Goal 2 (Week 1): Pt will don shirt with Mod A OT Short Term Goal 3 (Week 1): Pt will complete 1/3 components of donning pants   Skilled Therapeutic Interventions/Progress Updates:    Pt completed transfer from wheelchair to tub bench with mod assist, scooting across the sliding board, in the walk-in shower.  He was able to complete shower with min assist using the LH sponge for washing his back and his lower legs and feet.  He needed therapist assist for washing buttocks while completing lateral lean to the left side.  Max assist for scooting transfer from tub bench back to the wheelchair secondary to friction under his bottom from being wet.  Therapist used pillow case on the sliding board as well but it did not help.  Will try placing towel under pt next attempt and then have pt lean to the side so he can transfer across with towel under him.  Mod assist for transfer back to the edge of bed.  Pt hooks his UEs around therapist's in order to help with transfer, as therapist holds board and assist with sliding hips.  Once on the bed he completed all dressing with supervision and use of AE except for donning TEDs.  Reacher and sockaide were used to [employer] clothing and gripper socks over his feet.  Finished session with transfer back to the wheelchair in preparation for next therapy session.  Call button and phone in reach.    Therapy Documentation Precautions:  Precautions Precautions: Fall Required Braces or Orthoses: Other Brace/Splint Other Brace/Splint: Bilateral hands splints to be worn at all times with exception of ADLs/dressing  changes Restrictions Weight Bearing Restrictions: Yes RUE Weight Bearing: Weight bear through elbow only LUE Weight Bearing: Weight bear through elbow only RLE Weight Bearing: Non weight bearing LLE Weight Bearing: Non weight bearing  Pain: Pain Assessment Pain Assessment: Faces Faces Pain Scale: Hurts a little bit Pain Type: Surgical pain Pain Location: Leg Pain Orientation: Right;Left Pain Descriptors / Indicators: Discomfort Pain Intervention(s): Repositioned;Emotional support ADL: See Function Navigator for Current Functional Status.   Therapy/Group: Individual Therapy  Melford Tullier OTR/L 11/22/2016, 12:18 PM

## 2016-11-22 NOTE — Plan of Care (Signed)
Problem: RH BOWEL ELIMINATION Goal: RH STG MANAGE BOWEL WITH ASSISTANCE STG Manage Bowel with Min Assistance.  Outcome: Progressing  11/22/16 0418  Bowel Management Goals  STG: Pt will manage bowels with assistance 1-Total assistance   Goal: RH STG MANAGE BOWEL W/MEDICATION W/ASSISTANCE STG Manage Bowel with Medication and Min Assistance.  Outcome: Progressing  11/17/16 2112  Bowel Management Goals  STG: Pt will manage bowels with medication with assistance 1-Total assistance   Goal: RH STG MANAGE BOWEL W/EQUIPMENT W/ASSISTANCE STG Manage Bowel With Equipment and Min Assistance  Outcome: Progressing  11/17/16 2112  Bowel Management Goals  STG: Pt will manage bowels with equipment with assistance 1-Total assistance    Problem: RH SKIN INTEGRITY Goal: RH STG MAINTAIN SKIN INTEGRITY WITH ASSISTANCE STG Maintain Skin Integrity With Min Assistance.  Outcome: Progressing  11/22/16 0418  Skin Integrity Goals  STG: Maintain skin integrity with assistance 4-Minimal assistance   Goal: RH STG ABLE TO PERFORM INCISION/WOUND CARE W/ASSISTANCE STG Able To Perform Incision/Wound Care With [employer]Min Assistance.  Outcome: Progressing  11/21/16 0943  Skin Integrity Goals  STG: Pt will be able to perform incision/wound care with assistance 3-Moderate assistance    Problem: RH SAFETY Goal: RH STG DECREASED RISK OF FALL WITH ASSISTANCE STG Decreased Risk of Fall With [employer] Assistance.  Outcome: Progressing  11/21/16 0943  Safety Goals  ZOX:[id number]:Decreased risk of fall with assistance/device 2-Maximum assistance

## 2016-11-22 NOTE — Progress Notes (Signed)
Physical Therapy Session Note  Patient Details  Name: Evan Landry MRN: [id number] Date of Birth: 05-26-1985  Today's Date: 11/22/2016 PT Individual Time: 1100-1145 AND 1510-1545 PT Individual Time Calculation (min): 45 min AND 35 min  Short Term Goals: Week 1:  PT Short Term Goal 1 (Week 1): pt will roll R/L with supervision, use of bed featurs PT Short Term Goal 2 (Week 1): pt will move supine> sit with mod assist and use of bed features PT Short Term Goal 3 (Week 1): pt will transfer with slide board and assistance of 1 person  Skilled Therapeutic Interventions/Progress Updates:   Session 1:  Pt in w/c upon arrival and agreeable to therapy, no c/o pain. Total A w/c transport to/from gym. Transferred to/from mat w/ Mod A via slide board transfer. Returned to room and pt requesting to lay down 2/2 abdominal pain. Transferred to EOB and then supine w/ Mod A. Ended session in supine, call bell within reach and all needs met.   BLE and trunk strengthening exercises: -side crunches to elbow 2x5  -LAQs 1# 2x20  -seated heel slides w/ orange TB 2x10  -SAQs 2x20 -ankle pumps 2x20  -quad sets 2x15 -abduction slides in very short range 2x15 -isometric adduction 2x15  Session 2:  Pt supine upon arrival and agreeable to therapy, no c/o pain. Worked on bed mobility and strength w/ functional activity in room this session. Performed 5x supine to elbow propped and 5x sidelying to sitting on R side of bed for decreased difficulty and increased independence w/ bed mobility. Pt requesting to toilet at end of session. Transferred to EOB w/ supervision and to/from drop arm commode w/ Mod A using slide board transfer. Ended session in supine and in care of wife, all needs met.   Therapy Documentation Precautions:  Precautions Precautions: Fall Required Braces or Orthoses: Other Brace/Splint Other Brace/Splint: Bilateral hands splints to be worn at all times with exception of ADLs/dressing  changes Restrictions Weight Bearing Restrictions: Yes RUE Weight Bearing: Weight bear through elbow only LUE Weight Bearing: Weight bear through elbow only RLE Weight Bearing: Non weight bearing LLE Weight Bearing: Non weight bearing Pain: Pain Assessment Pain Assessment: Faces Faces Pain Scale: Hurts a little bit Pain Type: Surgical pain Pain Location: Leg Pain Orientation: Right;Left Pain Descriptors / Indicators: Discomfort Pain Intervention(s): Repositioned;Emotional support  See Function Navigator for Current Functional Status.   Therapy/Group: Individual Therapy  [name] K Arnette 11/22/2016, 12:39 PM

## 2016-11-22 NOTE — Progress Notes (Signed)
Occupational Therapy Session Note  Patient Details  Name: Mercie Eonhomas E Pennywell MRN: [id number] Date of Birth: 06/27/1985  Today's Date: 11/22/2016 OT Individual Time: 1300-1401 OT Individual Time Calculation (min): 61 min    Short Term Goals: Week 1:  OT Short Term Goal 1 (Week 1): Pt will complete toilet transfer with 1 helper and LRAD OT Short Term Goal 2 (Week 1): Pt will don shirt with Mod A OT Short Term Goal 3 (Week 1): Pt will complete 1/3 components of donning pants   Skilled Therapeutic Interventions/Progress Updates:    Pt completed transfer from bed to wheelchair with min assist using sliding board.  Therapist propelled his wheelchair down to the therapy gym.  He transferred to the therapy mat with mod assist as well.  Had him work on RUE and LUE FM coordination tasks, picking up small 1-2" pegs and placing in grid.  Had pt work on tip to tip pinch for each digit along with the thumb for the right hand.  He was able to use the first two digits along with the thumb on the left hand as well.  Progressed to having him pick up, place, and remove resistive clothespins with the RUE as well.  He was able to complete all levels.  He continues to demonstrate decreased ability to oppose the thumb and fifth digit of the left hand.  Placed wedge behind pt while sitting on the mat to work on modified sit ups to increase abdominal strength.  Pt only able to complete 4 with mod assist secondary to stomach pain.  Returned to room at end of session with call button and phone in reach.  Pt transferred back to the EOB to eat lunch.  Spouse present as well.     Therapy Documentation Precautions:  Precautions Precautions: Fall Required Braces or Orthoses: Other Brace/Splint Other Brace/Splint: Bilateral hands splints to be worn at all times with exception of ADLs/dressing changes Restrictions Weight Bearing Restrictions: Yes RUE Weight Bearing: Weight bear through elbow only LUE Weight Bearing: Weight  bear through elbow only RLE Weight Bearing: Non weight bearing LLE Weight Bearing: Non weight bearing  Pain: Pain Assessment Faces Pain Scale: Hurts a little bit ADL: See Function Navigator for Current Functional Status.   Therapy/Group: Individual Therapy  Shalimar Mcclain OTR/L 11/22/2016, 3:49 PM
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
## 2016-11-22 NOTE — Progress Notes (Signed)
Order x-rays on 10/31 and reviewed those images. It appears that his high superior pubic ramus fx on the right side has displaced a slight amount. With this finding I will continue NWB BLE and NOT advance to stand-pivot transfers on the right side. Will plan to follow up 6 weeks postop for repeat images.  Roby LoftsKevin P. Juel Ripley, MD [facility] [phone] (phone)

## 2016-11-23 ENCOUNTER — Ambulatory Visit (HOSPITAL_COMMUNITY): Payer: BLUE CROSS/BLUE SHIELD | Admitting: Physical Therapy

## 2016-11-23 ENCOUNTER — Encounter (HOSPITAL_COMMUNITY): Payer: BLUE CROSS/BLUE SHIELD

## 2016-11-23 NOTE — Plan of Care (Signed)
Problem: RH BOWEL ELIMINATION Goal: RH STG MANAGE BOWEL WITH ASSISTANCE STG Manage Bowel with Min Assistance.  Outcome: Progressing Ileus improving- pt still requiring multiple stool softners and occasional laxatives

## 2016-11-23 NOTE — Progress Notes (Addendum)
Occupational Therapy Session Note  Patient Details  Name: Evan Landry MRN: [id number] Date of Birth: 08/16/1985  Today's Date: 11/23/2016 OT Individual Time: 1100-1156 OT Individual Time Calculation (min): 56 min    Short Term Goals: Week 1:  OT Short Term Goal 1 (Week 1): Pt will complete toilet transfer with 1 helper and LRAD OT Short Term Goal 2 (Week 1): Pt will don shirt with Mod A OT Short Term Goal 3 (Week 1): Pt will complete 1/3 components of donning pants   Skilled Therapeutic Interventions/Progress Updates:    1:1. Family present for family training and participates [facility] from w/c. Wife, mother, father and step son all complete slide board transfers providing mod A for sliding with pt able to direct care. OT provides cues for w/c parts management, Body mechanics, and steadying slide board. Pt and wife declining bathing and dressing this session despite encouragement for pt to demonstrate strategies learned in OT. Pt's wife stating, "Lavenia Atlasve been here, I know all that stuff." [facility] working on pinch, clothing fasteners and threading beads. Exited session with seated EOB with family present in room.   Therapy Documentation Precautions:  Precautions Precautions: Fall Required Braces or Orthoses: Other Brace/Splint Other Brace/Splint: Bilateral hands splints to be worn at all times with exception of ADLs/dressing changes Restrictions Weight Bearing Restrictions: Yes RUE Weight Bearing: Weight bear through elbow only LUE Weight Bearing: Weight bear through elbow only RLE Weight Bearing: Non weight bearing LLE Weight Bearing: Non weight bearing  See Function Navigator for Current Functional Status.   Therapy/Group: Individual Therapy  Shon HaleStephanie M Terrence Pizana 11/23/2016, 6:57 AM

## 2016-11-23 NOTE — Progress Notes (Signed)
Physical Therapy Weekly Progress Note  Patient Details  Name: Evan Landry MRN: [id number] Date of Birth: Jul 17, 1985  Beginning of progress report period: November 17, 2016 End of progress report period: November 23, 2016  Today's Date: 11/23/2016 PT Individual Time: 1000-1045 PT Individual Time Calculation (min): 45 min    Patient has met 3 of 3 short term goals. He is progressing well w/ therapies focusing on independence w/ functional mobility and reducing caregiver burden for planned d/c home on 11/8. He is consistently transferring w/ assistance of 1 person, ranging from Mod-Max A depending on set-up of transfer, and performing bed mobility w/ supervision to [place] A for LE management. NWB status of BLEs continues to be the biggest barrier for ease of transfer, however pt is improving trunk control and strength to assist w/ transfers. He requires occasional verbal cues to adhere to NWB precautions. Focus prior to D/C will continue to focus on decreasing assistance w/ transfers including family education, LE strengthening within precautions, and D/C planning.   Patient continues to demonstrate the following deficits muscle weakness and muscle joint tightness and decreased cardiorespiratoy endurance and therefore will continue to benefit from skilled PT intervention to increase functional independence with mobility.  Patient progressing toward long term goals..  Continue plan of care.  PT Short Term Goals Week 1:  PT Short Term Goal 1 (Week 1): pt will roll R/L with supervision, use of bed featurs PT Short Term Goal 1 - Progress (Week 1): Met PT Short Term Goal 2 (Week 1): pt will move supine> sit with mod assist and use of bed features PT Short Term Goal 2 - Progress (Week 1): Met PT Short Term Goal 3 (Week 1): pt will transfer with slide board and assistance of 1 person PT Short Term Goal 3 - Progress (Week 1): Met Week 2:  PT Short Term Goal 1 (Week 2): =LTGs due to ELOS  Skilled  Therapeutic Interventions/Progress Updates:   Pt supine upon arrival and agreeable to therapy, no c/o pain. Family present for education this morning. Instructed and performed multiple slide board transfers w/ son-in-law and mother - wife CNA and competent w/ transfers. Performed transfers to/from bed and car transfer. All verbalized understanding and returned demonstration of safe technique. Provided written handout of LE strengthening exercises pt has been performing in PT. Ended session in w/c and in care of family, all needs met.   Therapy Documentation Precautions:  Precautions Precautions: Fall Required Braces or Orthoses: Other Brace/Splint Other Brace/Splint: Bilateral hands splints to be worn at all times with exception of ADLs/dressing changes Restrictions Weight Bearing Restrictions: Yes RUE Weight Bearing: Weight bear through elbow only LUE Weight Bearing: Weight bear through elbow only RLE Weight Bearing: Non weight bearing LLE Weight Bearing: Non weight bearing  See Function Navigator for Current Functional Status.  Therapy/Group: Individual Therapy  Everline Mahaffy K Arnette 11/23/2016, 12:21 PM
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
## 2016-11-24 ENCOUNTER — Inpatient Hospital Stay (HOSPITAL_COMMUNITY): Payer: BLUE CROSS/BLUE SHIELD
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
## 2016-11-24 NOTE — Progress Notes (Signed)
Physical Therapy Session Note  Patient Details  Name: Evan Landry MRN: [id number] Date of Birth: 03/06/1985  Today's Date: 11/24/2016 PT Individual Time: 0901-1000 PT Individual Time Calculation (min): 59 min   Short Term Goals: Week 2:  PT Short Term Goal 1 (Week 2): =LTGs due to ELOS  Skilled Therapeutic Interventions/Progress Updates:    Pt sitting EOB with RN upon PT arrival, agreeable to therapy tx and reports no pain at rest, pain up to 7/10 during ROM exercises. Pt used reaching device to don pants and performed sidelying on elbow x 3 each side in order to pull pants over hips, with min assist. Pt transferred from bed>w/c using slideboard and mod assist, sidelying on elbow in order to perform lateral weightshift to place board under hips, pushing from bed rails using elbow to help scoot. Pt adjusted hips in w/c pushing up with elbows on w/c armrests with supervision. Pt transported to gym in w/c total assist. Pt transferred w/c<>mat using slideboard and mod assist. Pt transferred sitting<>supine with mod assist to lift LEs. In supine worked on Le[employer]AR[employer] exercises for hip flexion, hip abduction and hip adduction 1 x 10 each. In supine pt performed 2 x 10 heel slides bilaterally. Pt transferred supine>propped on elbows>long sitting with mod assist, pt able to tolerate long sitting position for hamstring stretch. Pt seated edge of mat performed 1 x 10 LAQ bilaterally. Pt transported back to room in w/c and left seated in w/c with wife present and needs in reach.   Therapy Documentation Precautions:  Precautions Precautions: Fall Required Braces or Orthoses: Other Brace/Splint Other Brace/Splint: Bilateral hands splints to be worn at all times with exception of ADLs/dressing changes Restrictions Weight Bearing Restrictions: Yes RUE Weight Bearing: Weight bear through elbow only LUE Weight Bearing: Weight bear through elbow only RLE Weight Bearing: Non weight bearing LLE Weight Bearing:  Non weight bearing   See Function Navigator for Current Functional Status.   Therapy/Group: Individual Therapy  Cresenciano GenreEmily van Schagen, PT, DPT 11/24/2016, 9:45 AM

## 2016-11-24 NOTE — Plan of Care (Signed)
  RH BOWEL ELIMINATION RH STG MANAGE BOWEL WITH ASSISTANCE Description STG Manage Bowel with Min Assistance.  11/24/2016 2341 - Progressing by Martina SinnerMurray, Daryle Amis A, RN 11/24/2016 2338 - Progressing by Martina SinnerMurray, Ninah Moccio A, RN   RH BOWEL ELIMINATION RH STG MANAGE BOWEL W/MEDICATION W/ASSISTANCE Description STG Manage Bowel with Medication and Min Assistance.  11/24/2016 2341 - Progressing by Martina SinnerMurray, Aarushi Hemric A, RN 11/24/2016 2338 - Progressing by Martina SinnerMurray, Rossana Molchan A, RN   RH SKIN INTEGRITY RH STG SKIN FREE OF INFECTION/BREAKDOWN Description Free of breakdown during stay on CIPR  11/24/2016 2341 - Progressing by Martina SinnerMurray, Eura Radabaugh A, RN 11/24/2016 2338 - Progressing by Martina SinnerMurray, Skylie Hiott A, RN   RH SKIN INTEGRITY RH STG MAINTAIN SKIN INTEGRITY WITH ASSISTANCE Description STG Maintain Skin Integrity With Min Assistance.  11/24/2016 2341 - Progressing by Martina SinnerMurray, Terilyn Sano A, RN 11/24/2016 2338 - Progressing by Martina SinnerMurray, Daylen Lipsky A, RN   RH SKIN INTEGRITY RH STG ABLE TO PERFORM INCISION/WOUND CARE W/ASSISTANCE Description STG Able To Perform Incision/Wound Care With Min Assistance with wife assistance.  11/24/2016 2341 - Progressing by Martina SinnerMurray, Mckyle Solanki A, RN 11/24/2016 2338 - Progressing by Martina SinnerMurray, Keiasia Christianson A, RN   RH SAFETY RH STG ADHERE TO SAFETY PRECAUTIONS W/ASSISTANCE/DEVICE Description STG Adhere to Safety Precautions With Min Assistance/Device.  11/24/2016 2341 - Progressing by Martina SinnerMurray, Climmie Buelow A, RN 11/24/2016 2338 - Progressing by Martina SinnerMurray, Jaecion Dempster A, RN   RH PAIN MANAGEMENT RH STG PAIN MANAGED AT OR BELOW PT'S PAIN GOAL Description Pain less than 4 on 1-10 scale  11/24/2016 2341 - Progressing by Martina SinnerMurray, Keeghan Mcintire A, RN 11/24/2016 2338 - Progressing by Martina SinnerMurray, Luisa Louk A, RN

## 2016-11-25 ENCOUNTER — Inpatient Hospital Stay (HOSPITAL_COMMUNITY): Payer: BLUE CROSS/BLUE SHIELD

## 2016-11-25 ENCOUNTER — Inpatient Hospital Stay (HOSPITAL_COMMUNITY): Payer: BLUE CROSS/BLUE SHIELD | Admitting: Occupational Therapy
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
## 2016-11-25 NOTE — Progress Notes (Signed)
Physical Therapy Session Note  Patient Details  Name: Mercie Eonhomas E Gullett MRN: [id number] Date of Birth: 11/05/1985  Today's Date: 11/25/2016 PT Individual Time: 1100-1200, 1400-1443 PT Individual Time Calculation (min): 60 min , 43 min  Short Term Goals: Week 2:  PT Short Term Goal 1 (Week 2): =LTGs due to ELOS  Skilled Therapeutic Interventions/Progress Updates:   Session 1:  Pt sitting in w/c upon PT arrival, agreeable to therapy tx and reports minimal pain 2/10. Pt transported to gym in w/c total assist. Pt seated in w/c performed stretching and strengthening exercises for LEs: hamstring stretch 3 x 30 sec, calf stretch 2 x 30 sec, LAQ 2 x 10, quad sets 2 x 10, quad stretch 2 x 30 sec, hip abduction with level 1 TB 2 x 10. Pt performed car transfer with mod assist using slideboard, verbal cues for techniques, pushing off w/c and car seat with elbow. Pt left seated in w/c at end of session with needs in reach.    Session 2:  Pt sitting in w/c upon PT arrival, agreeable to therapy tx and reports pain 2/10. Pt transported to gym in w/c total assist. Pt transferred from w/c<>mat using slideboard and mod assist, pt pushes off armrests on w/c using elbows. Pt transferred from sitting>supine with mod assist to lift LEs. Pt performed x 10 heel slides, x 10 hip abduction and x 10 adduction AAROM. Pt transferred from supine>long sitting with mod assist, in long sitting for hamstring stretch. Pt transferred back to supine with mod assist. Pt transferred supine>sidelying with min assist and therapist performed hip flexion and quadriceps stretch 2 x 30 sec. Pt transferred back to chair and left seated in w/c at end of session with needs in reach.   Therapy Documentation Precautions:  Precautions Precautions: Fall Required Braces or Orthoses: Other Brace/Splint Other Brace/Splint: Bilateral hands splints to be worn at all times with exception of ADLs/dressing changes Restrictions Weight Bearing  Restrictions: Yes RUE Weight Bearing: Weight bear through elbow only LUE Weight Bearing: Weight bear through elbow only RLE Weight Bearing: Non weight bearing LLE Weight Bearing: Non weight bearing   See Function Navigator for Current Functional Status.   Therapy/Group: Individual Therapy  Cresenciano GenreEmily van Schagen, PT, DPT 11/25/2016, 11:42 AM

## 2016-11-25 NOTE — Progress Notes (Signed)
Occupational Therapy Session Note  Patient Details  Name: Evan Landry MMercie EonRN: [id number] Date of Birth: 11/09/1985  Today's Date: 11/25/2016 OT Individual Time: [id number] OT Individual Time Calculation (min): 42 min    Short Term Goals: Week 2:  OT Short Term Goal 1 (Week 2): Continue working toward established LTGs   Skilled Therapeutic Interventions/Progress Updates:    Took pt to the therapy gym where he transferred to the therapy mat with mod assist using the sliding board.  Once on the mat had pt working on BUE shoulder and arm strengthening with use of the Wii and boxing games.  Pt was able to hold bilateral controllers in his hand while engaged in using both UEs together for activity.  Progressed to having 2lb wrist weights over his forearms to increase UE work.  Incorporated scooting on the mat with pt leaning down onto his opposite forearm and pushing away in order to scoot both right and left.  Pt with increased UE fatigue reported at end of session.  Returned back to wheelchair and back to the room at end of session with call button in reach and family present.   Therapy Documentation Precautions:  Precautions Precautions: Fall Required Braces or Orthoses: Other Brace/Splint Other Brace/Splint: Bilateral hands splints to be worn at all times with exception of ADLs/dressing changes Restrictions Weight Bearing Restrictions: Yes RUE Weight Bearing: Weight bear through elbow only LUE Weight Bearing: Weight bear through elbow only RLE Weight Bearing: Non weight bearing LLE Weight Bearing: Non weight bearing   Pain: Pain Assessment Pain Assessment: No/denies pain Pain Score: 0-No pain ADL: ADL ADL Comments: Please see functional navigator for ADL status  See Function Navigator for Current Functional Status.   Therapy/Group: Individual Therapy  Daniel Ritthaler OTR/L 11/25/2016, 3:40 PM

## 2016-11-25 NOTE — Progress Notes (Signed)
Occupational Therapy Weekly Progress Note  Patient Details  Name: Evan Landry MRN: [id number] Date of Birth: 07-20-1985  Beginning of progress report period: November 17, 2016 End of progress report period: November 25, 2016  Today's Date: 11/25/2016 OT Individual Time: 0211-1552 OT Individual Time Calculation (min): 55 min    Patient has met 3 of 3 short term goals.  Evan Landry is making steady progress with OT at this time.  He completes all UB selfcare at supervision level with LB selfcare at min to mod assist level in sitting.  Min assist is still needed for pulling pants over hips during lateral leans side to side.  Mod assist for sliding board transfers to the wide drop arm commode with min assist for clothing management and toilet hygiene.  He continues to need Bilateral hand splints but removes them for toileting and bathing tasks.  He is able to self manage doffing and donning them.  Feel he is on target to meet established LTGs with anticipation of discharge 11/8.  Will continue with OT POC until expected d/c date.  Patient continues to demonstrate the following deficits: muscle weakness and muscle joint tightness and decreased sitting balance and decreased balance strategies and therefore will continue to benefit from skilled OT intervention to enhance overall performance with BADL and Reduce care partner burden.  Patient progressing toward long term goals..  Continue plan of care.  OT Short Term Goals Week 2:  OT Short Term Goal 1 (Week 2): Continue working toward established LTGs   Skilled Therapeutic Interventions/Progress Updates:    Pt completed dressing at EOB to start session.  Evan Landry donned his sweatpants over his LEs with supervision this session and without use of the reacher.   Min assist for pulling pants up on the left side while completing lateral lean to the right.  He was able to pull them up on the right.  Therapist assisted with donning TEDs and then he was  able to donn gripper socks with use of the sockaide.  Mod assist for transfer to the wheelchair via sliding board, with mod instructional cueing to avoid using LEs to assist or pulling with either hand.  Took pt to the therapy gym where he transferred to the mat at the same level.  Had him work on lateral leans side to side while picking up and placing clothespins on vertical grid with the RUE.  Also had pt complete abdominal strengthening by performing modified crunches using wedge with pillows stacked on top and having pt lean back to the wedge then return to sitting with min assist.  Transferred back to the chair and then back to the room where he was positioned at bedside with family present and call button in reach.    Therapy Documentation Precautions:  Precautions Precautions: Fall Required Braces or Orthoses: Other Brace/Splint Other Brace/Splint: Bilateral hands splints to be worn at all times with exception of ADLs/dressing changes Restrictions Weight Bearing Restrictions: Yes RUE Weight Bearing: Weight bear through elbow only LUE Weight Bearing: Weight bear through elbow only RLE Weight Bearing: Non weight bearing LLE Weight Bearing: Non weight bearing  Pain: Pain Assessment Pain Assessment: No/denies pain ADL: See Function Navigator for Current Functional Status.   Therapy/Group: Individual Therapy  Evan Landry OTR/L 11/25/2016, 12:28 PM

## 2016-11-26 ENCOUNTER — Inpatient Hospital Stay (HOSPITAL_COMMUNITY): Payer: BLUE CROSS/BLUE SHIELD | Admitting: Occupational Therapy

## 2016-11-26 ENCOUNTER — Inpatient Hospital Stay (HOSPITAL_COMMUNITY): Payer: BLUE CROSS/BLUE SHIELD | Admitting: Physical Therapy

## 2016-11-26 ENCOUNTER — Inpatient Hospital Stay (HOSPITAL_COMMUNITY): Payer: BLUE CROSS/BLUE SHIELD

## 2016-11-26 MED ORDER — DOCUSATE SODIUM 100 MG PO CAPS
100.0000 mg | ORAL_CAPSULE | Freq: Two times a day (BID) | ORAL | Status: DC
  Administered 2016-11-26 – 2016-11-27 (×4): 100 mg via ORAL
  Filled 2016-11-26 (×5): qty 1

## 2016-11-26 MED ORDER — METOCLOPRAMIDE HCL 10 MG PO TABS
10.0000 mg | ORAL_TABLET | Freq: Two times a day (BID) | ORAL | Status: DC
  Administered 2016-11-26 – 2016-11-27 (×2): 10 mg via ORAL
  Filled 2016-11-26 (×2): qty 1

## 2016-11-26 NOTE — Progress Notes (Signed)
Occupational Therapy Session Note  Patient Details  Name: Evan Landry MRN: [id number] Date of Birth: 04/11/1985  Today's Date: 11/26/2016 OT Individual Time: 4098-11910908-1004 OT Individual Time Calculation (min): 56 min    Short Term Goals: Week 2:  OT Short Term Goal 1 (Week 2): Continue working toward established LTGs   Skilled Therapeutic Interventions/Progress Updates:    Pt transferred to the shower seat with mod assist from wheelchair using the sliding board.  Max assist for transfer out of the shower with use of sliding board secondary to increased friction.  Therapist used towels under pt's hips to make sliding across the board easier.  Mod assist for transfer back to the EOB for dressing tasks. He was able to thread pants over feet and then pullover his hips with mod assist.  Supervision for donning pullover shirt.  Therapist assisted with TEDs and gripper socks secondary to time.  Transfer back to the wheelchair with sliding board completed to finish session.  Call button and phone in reach and nursing present to administer medications.    Therapy Documentation Precautions:  Precautions Precautions: Fall Required Braces or Orthoses: Other Brace/Splint Other Brace/Splint: Bilateral hands splints to be worn at all times with exception of ADLs/dressing changes Restrictions Weight Bearing Restrictions: Yes RUE Weight Bearing: Weight bear through elbow only LUE Weight Bearing: Weight bear through elbow only RLE Weight Bearing: Non weight bearing LLE Weight Bearing: Non weight bearing  Pain: Pain Assessment Pain Assessment: No/denies pain Pain Score: 0-No pain ADL: See Function Navigator for Current Functional Status.   Therapy/Group: Individual Therapy  Charlena Haub OTR/L 11/26/2016, 10:04 AM
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
## 2016-11-26 NOTE — Progress Notes (Signed)
Occupational Therapy Session Note  Patient Details  Name: Mercie Eonhomas E Appelhans MRN: [id number] Date of Birth: 10/02/1985  Today's Date: 11/26/2016 OT Individual Time: 1300-1357 OT Individual Time Calculation (min): 57 min    Short Term Goals: Week 2:  OT Short Term Goal 1 (Week 2): Continue working toward established LTGs   Skilled Therapeutic Interventions/Progress Updates:    Pt completed sliding board transfers from wheelchair to and from the therapy mat during session with mod assist after board placement.  He was able to work on lateral leans in sitting on the therapy mat in order to complete simulated clothing management using theraband for LB dressing and toileting tasks.  Min assist needed to complete on the left side.  Also had pt work on core strengthening and UE strengthening and endurance while engaged in Wii activity using BUEs.  Once completed, pt was taken back to his room where his mother and father were present.      Therapy Documentation Precautions:  Precautions Precautions: Fall Required Braces or Orthoses: Other Brace/Splint Other Brace/Splint: Bilateral hands splints to be worn at all times with exception of ADLs/dressing changes Restrictions Weight Bearing Restrictions: Yes RUE Weight Bearing: Weight bear through elbow only LUE Weight Bearing: Weight bear through elbow only RLE Weight Bearing: Non weight bearing LLE Weight Bearing: Non weight bearing  Pain: Pain Assessment Pain Assessment: Faces Faces Pain Scale: Hurts a little bit Pain Type: Acute pain Pain Location: Abdomen Pain Descriptors / Indicators: Aching Pain Onset: On-going Pain Intervention(s): Emotional support ADL: Therapy/Group: Individual Therapy  Adabella Stanis OTR/L 11/26/2016, 3:49 PM

## 2016-11-26 NOTE — Plan of Care (Signed)
Goals upgraded based on progress

## 2016-11-26 NOTE — Progress Notes (Signed)
Physical Therapy Session Note  Patient Details  Name: Evan Landry MRN: [id number] Date of Birth: 04/17/1985  Today's Date: 11/26/2016 PT Individual Time: [id number] PT Individual Time Calculation (min): 41 min   Short Term Goals: Week 2:  PT Short Term Goal 1 (Week 2): =LTGs due to ELOS  Skilled Therapeutic Interventions/Progress Updates:    Pt sitting in w/c upon PT arrival, agreeable to therapy tx and reports minimal pain 2/10. Pt transported to gym in w/c total assist. Pt seated in w/c performed stretching and strengthening exercises for LEs: hamstring stretch 3 x 30 sec, calf stretch 2 x 30 sec, LAQ 2 x 10, quad sets 2 x 10, hip abduction with level 1 TB 2 x 10. Pt performed w/c>bed transfer with mod assist using slideboard, verbal cues for techniques, pushing off w/c with elbow. Pt transferred from sitting>supine with min assist. Pt left supine in bed in w/c at end of session with needs in reach.     Therapy Documentation Precautions:  Precautions Precautions: Fall Required Braces or Orthoses: Other Brace/Splint Other Brace/Splint: Bilateral hands splints to be worn at all times with exception of ADLs/dressing changes Restrictions Weight Bearing Restrictions: Yes RUE Weight Bearing: Weight bear through elbow only LUE Weight Bearing: Weight bear through elbow only RLE Weight Bearing: Non weight bearing LLE Weight Bearing: Non weight bearing   See Function Navigator for Current Functional Status.   Therapy/Group: Individual Therapy  Cresenciano GenreEmily van Schagen, PT, DPT 11/26/2016, 8:13 AM

## 2016-11-26 NOTE — Progress Notes (Signed)
Physical Therapy Session Note  Patient Details  Name: Evan Landry MRN: [id number] Date of Birth: April 04, 1985  Today's Date: 11/26/2016 PT Individual Time: 1100-1157 PT Individual Time Calculation (min): 57 min   Short Term Goals: Week 2:  PT Short Term Goal 1 (Week 2): =LTGs due to ELOS  Skilled Therapeutic Interventions/Progress Updates:   Pt in w/c upon arrival and agreeable to therapy, no c/o pain. Requesting to toilet, transferred to/from drop arm commode via Mod A using slide board. Set-up assist for pericare. Total A w/c transport to/from gym. Transferred to/from mat w/ Mod A via same techinque. Performed BLE strengthening and ROM exercises as listed below. Returned to room and ended session in w/c, call bell within reach and all needs met.   BLE strengthening/ROM exercises: -LAQs 2x10  -seated heel slides 2x10  -side crunch onto elbow 2x5 bilaterally  -ankle pumps 2x20 -quad set 2x20  -SAQs 2x20 -abduction slides 1x10  -assisted SLR 2x10   Therapy Documentation Precautions:  Precautions Precautions: Fall Required Braces or Orthoses: Other Brace/Splint Other Brace/Splint: Bilateral hands splints to be worn at all times with exception of ADLs/dressing changes Restrictions Weight Bearing Restrictions: Yes RUE Weight Bearing: Weight bear through elbow only LUE Weight Bearing: Weight bear through elbow only RLE Weight Bearing: Non weight bearing LLE Weight Bearing: Non weight bearing Vital Signs: Therapy Vitals Pulse Rate: (!) 112 BP: 123/75 Pain: Pain Assessment Pain Assessment: No/denies pain Pain Score: 0-No pain  See Function Navigator for Current Functional Status.   Therapy/Group: Individual Therapy  Liyat Faulkenberry K Arnette 11/26/2016, 11:57 AM

## 2016-11-27 ENCOUNTER — Inpatient Hospital Stay (HOSPITAL_COMMUNITY): Payer: BLUE CROSS/BLUE SHIELD

## 2016-11-27 ENCOUNTER — Inpatient Hospital Stay (HOSPITAL_COMMUNITY): Payer: BLUE CROSS/BLUE SHIELD | Admitting: Occupational Therapy

## 2016-11-27 LAB — GLUCOSE, CAPILLARY: GLUCOSE-CAPILLARY: 99 mg/dL (ref 65–99)

## 2016-11-27 MED ORDER — METOCLOPRAMIDE HCL 10 MG PO TABS
5.0000 mg | ORAL_TABLET | Freq: Two times a day (BID) | ORAL | Status: DC
  Administered 2016-11-27 – 2016-11-28 (×2): 5 mg via ORAL
  Filled 2016-11-27 (×2): qty 1
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
## 2016-11-27 NOTE — Progress Notes (Addendum)
Occupational Therapy Discharge Summary  Patient Details  Name: Evan Landry MRN: [id number] Date of Birth: April 22, 1985  Today's Date: 11/27/2016 OT Individual Time: 0947-0962 OT Individual Time Calculation (min): 29 min   Session Note:  Pt transferred from recliner to wheelchair with mod assist using the sliding board.  Took him to the therapy gym where he was educated on theraputty exercises for the right hand during session.  Provided handout for reference as well.  He was able to demonstrate tip to tip pinch for all digits except for the 5th.  He was also able to demonstrate gross digit flexion.  Discussed not performing these exercises on the left hand at this time secondary to thumb pinning.  Pt returned to room and transferred back to bed with family present.  Mod assist for sliding board transfer to the bed.    Patient has met 8 of 8 long term goals due to improved balance and ability to compensate for deficits.  Patient to discharge at overall Mod Assist level.  Patient's care partner is independent to provide the necessary physical assistance at discharge.    Reasons goals not met: NA  Recommendation:  Feel pt has reached his current functional potential in relation to selfcare performance.  No follow-up OT recommended at this time until he goes back to the hand surgeon and then outpatient can be prescribed if needed based on MDs guidance.    Equipment: drop arm commode  Reasons for discharge: treatment goals met and discharge from hospital  Patient/family agrees with progress made and goals achieved: Yes  OT Discharge Precautions/Restrictions  Precautions Precautions: Fall Required Braces or Orthoses: Other Brace/Splint Other Brace/Splint: Bilateral hands splints to be worn at all times with exception of ADLs/dressing changes Restrictions Weight Bearing Restrictions: Yes RUE Weight Bearing: Weight bear through elbow only LUE Weight Bearing: Weight bear through elbow  only RLE Weight Bearing: Non weight bearing LLE Weight Bearing: Non weight bearing   Pain Pain Assessment Pain Assessment: No/denies pain Pain Score: 0-No pain ADL ADL ADL Comments: Please see functional navigator for ADL status Vision Baseline Vision/History: Wears glasses Wears Glasses: Distance only Patient Visual Report: No change from baseline Vision Assessment?: No apparent visual deficits Perception  Perception: Within Functional Limits Praxis Praxis: Intact Cognition Overall Cognitive Status: Within Functional Limits for tasks assessed Arousal/Alertness: Awake/alert Orientation Level: Oriented X4 Attention: Sustained Sustained Attention: Appears intact Memory: Appears intact Awareness: Appears intact Problem Solving: Appears intact Safety/Judgment: Appears intact Sensation Sensation Light Touch: Appears Intact Stereognosis: Appears Intact Hot/Cold: Appears Intact Proprioception: Appears Intact Additional Comments: Pt reports slight numbness at distal end of ring finger on right hand from CBG checks.  No other issues noted.   Coordination Gross Motor Movements are Fluid and Coordinated: No Fine Motor Movements are Fluid and Coordinated: No Coordination and Movement Description: Pt with bilateral hand splints but can use the UEs during selfcare tasks at modified independence.   Motor  Motor Motor - Skilled Clinical Observations: generalized weakness Mobility    See Function Section of chart for details Trunk/Postural Assessment  Cervical Assessment Cervical Assessment: Within Functional Limits Thoracic Assessment Thoracic Assessment: Within Functional Limits Lumbar Assessment Lumbar Assessment: Within Functional Limits Postural Control Postural Control: Within Functional Limits  Balance Balance Balance Assessed: Yes Static Sitting Balance Static Sitting - Level of Assistance: 6: Modified independent (Device/Increase time) Dynamic Sitting  Balance Dynamic Sitting - Level of Assistance: 6: Modified independent (Device/Increase time) Extremity/Trunk Assessment RUE Assessment RUE Assessment: Exceptions to [facility] RUE  Strength RUE Overall Strength Comments: Pt with AROM WFLS for shoulder and elbow with strength 4/5.  Able to demonstrate full grasp and release with strength at 3+/5 for grip.  He is able to oppose thumb to all digits except for the 5th.  Still wearing wrist cock-up splint at all times except for selfcare/bathing.  LUE Assessment LUE Assessment: Exceptions to [facility] LUE Strength LUE Overall Strength Comments: AROM shoulder and elbow flexion WFLs with strength 4/5.  Pt with external pins at [facility] joint of thumb and currently in thumb spica splint.  He demonstrates AROM WFLs for digit flexion and extension of all other digits except for thumb.     See Function Navigator for Current Functional Status.  Jameka Ivie OTR/L 11/27/2016, 4:23 PM

## 2016-11-27 NOTE — Discharge Summary (Signed)
Discharge summary job [id number]

## 2016-11-27 NOTE — Progress Notes (Signed)
Performed dressing change per MD order to left hand.  Pin site care performed with alcohol.  Education provided for patient on dressing changes at home.  Dani Gobbleeardon, Garlene Apperson J, RN

## 2016-11-27 NOTE — Progress Notes (Signed)
Physical Therapy Discharge Summary  Patient Details  Name: Evan Landry MRN: [id number] Date of Birth: 05/17/1985  Today's Date: 11/27/2016 PT Individual Time: 7106-2694, 1301-1330 PT Individual Time Calculation (min): 71 min, 29 min    Patient has met 3 of 4 long term goals due to improved activity tolerance, improved balance, increased range of motion and decreased pain.  Patient to discharge at a wheelchair level [place].   Patient's care partner is independent to provide the necessary physical assistance at discharge. Pt's wife, step son and mother have completed family training and are independent in providing pt assistance for transfers, w/c transportation and bed mobility.   Reasons goals not met: Pt did not meet his transfer goal secondary to his continued NWB precautions for LEs and only WB through elbows in UEs, requiring mod assist for all transfers.   Recommendation:  Patient will benefit from ongoing skilled PT services in home health setting to continue to advance safe functional mobility, address ongoing impairments in strength, ROM, activity tolerance, pain, and minimize fall risk.  Equipment: w/c and slideboard  Reasons for discharge: treatment goals met  Patient/family agrees with progress made and goals achieved: Yes   PT Treatment Interventions: Session 1: Pt seated in w/c upon PT arrival, agreeable to therapy tx and reports 2/10 pain at rest. Pt transported to gym total assist. Discharge summary completed this session which emphasis on transfers, bed mobility and LE ROM/strengthening. Pt unable to perform w/c mobility, standing, gait and stairs secondary to NWB B LE/B UE precautions. Pt performed slideboard transfer from w/c<>mat with mod assist, leaning onto elbow to place board, pushing through elbows on armrests to help scoot, and weightshifting at the pelvis. Pt performed bed mobility with min assist sitting>supine to help lift L LE, pt transferred from  supine>sitting with supervision. Pt is able to perform bed mobility at supervision/min assist level however it increases his pain to 8/10, discussed having family help at home as needed to minimize pain. In supine therapist performed ROM and stretches at the hip/knee/ankles. Pt performed therex strengthening: 2 x 10 SAQ, 2 x 10 heel slides, AAROM hip abd/adduction. Seated edge of mat pt performed 2 x 10 LAQ and 2 x 30 sec hamstring stretch. Pt performed car transfer with mod assist using slideboard while maintaining precautions. Pt transferred from w/c>recliner at end of session using slideboard and mod assist, left seated in recliner at end of session with needs in reach and family present.   Session 2: Pt seated in recliner upon PT arrival, agreeable to therapy tx and reports pain 4/10 in hips. Pt transported to gym in w/c total assist. Pt performed hamstring stretch B LEs 2 x 30 sec. Pt performed 2 x 10 seated hip abduction with TB, 2 x 10 ball squeezes and 2 x 10 LAQ. Pt transferred from w/c>recliner, mod assist using slideboard. Pt left seated in recliner with needs in reach.   PT Discharge Precautions/Restrictions Precautions Precautions: Fall Required Braces or Orthoses: Other Brace/Splint Other Brace/Splint: Bilateral hands splints to be worn at all times with exception of ADLs/dressing changes Restrictions Weight Bearing Restrictions: Yes RUE Weight Bearing: Weight bear through elbow only LUE Weight Bearing: Weight bear through elbow only RLE Weight Bearing: Non weight bearing LLE Weight Bearing: Non weight bearing Vital Signs Therapy Vitals Temp Source: Oral Pulse Rate: (!) 102 BP: 118/68 Patient Position (if appropriate): Lying Oxygen Therapy SpO2: 98 % O2 Device: Not Delivered Pain Pain Assessment Pain Assessment: 0-10 Pain Score: 2 (working  with therapy) Pain Type: Acute pain Pain Location: Leg Pain Orientation: Right;Left Pain Descriptors / Indicators: Aching Pain  Frequency: Intermittent Pain Onset: With Activity Patients Stated Pain Goal: 3 Pain Intervention(s): Medication (See eMAR) Cognition Overall Cognitive Status: Within Functional Limits for tasks assessed Arousal/Alertness: Awake/alert Orientation Level: Oriented X4 Attention: Sustained Sustained Attention: Appears intact Memory: Appears intact Awareness: Appears intact Problem Solving: Appears intact Safety/Judgment: Appears intact Sensation Sensation Light Touch: Appears Intact Proprioception: Appears Intact Coordination Gross Motor Movements are Fluid and Coordinated: No Fine Motor Movements are Fluid and Coordinated: No Coordination and Movement Description: Affected by pain, muscle tightness and precaution limitations  Heel Shin Test: NT Motor  Motor Motor - Skilled Clinical Observations: generalized weakness  Trunk/Postural Assessment  Cervical Assessment Cervical Assessment: Within Functional Limits Thoracic Assessment Thoracic Assessment: Within Functional Limits Lumbar Assessment Lumbar Assessment: Exceptions to WFL(anterior pelvic tilt and increased lumbar lordosis, especially when laying flat in supine ) Postural Control Postural Control: Within Functional Limits  Balance Balance Balance Assessed: Yes Static Sitting Balance Static Sitting - Level of Assistance: 6: Modified independent (Device/Increase time) Dynamic Sitting Balance Dynamic Sitting - Level of Assistance: 6: Modified independent (Device/Increase time) Extremity Assessment  RLE Assessment RLE Assessment: Exceptions to [facility] RLE AROM (degrees) RLE Overall AROM Comments: hip flexion to 68 degrees, hip extension lacking 5 degrees RLE PROM (degrees) RLE Overall PROM Comments: hip flexion to 90 degrees, hip abduction to 25 deg, hip extension lacking 5 degrees (limited by pain and tightness), knee ROM WF, DF to neutral  RLE Strength RLE Overall Strength Comments: grossly in sitting: 3-/5 hip flex, 3+/5  knee ext, 4/5 ankle DF LLE Assessment LLE Assessment: Exceptions to WFL LLE AROM (degrees) LLE Overall AROM Comments: hip flexion to 65 deg, hip abduction to 10 deg, limited hip extension secondary to pain (pt keeps hip at about 18 deg flex) LLE PROM (degrees) LLE Overall PROM Comments: hip flexion to 85 deg, hip abduction to 18 deg, hip extension lacking 15 degrees, knee flexion/extension WFL with quadriceps tightness, DF to neutral LLE Strength LLE Overall Strength Comments: grossly in sitting: 2-/5 hip flex, 3+/5 knee ext, 4/5 ankle DF   See Function Navigator for Current Functional Status.  Netta Corrigan, PT, DPT 11/27/2016, 10:16 AM
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
## 2016-11-27 NOTE — Discharge Instructions (Signed)
Inpatient Rehab Discharge Instructions  Mercie Eonhomas E Kagel Discharge date and time: No discharge date for patient encounter.   Activities/Precautions/ Functional Status: Activity: Nonweightbearing bilateral lower extremities. Nonweightbearing bilateral upper extremities with weightbearing through elbows only Diet: regular diet Wound Care: keep wound clean and dry Functional status:  ___ No restrictions     ___ Walk up steps independently ___ 24/7 supervision/assistance   ___ Walk up steps with assistance ___ Intermittent supervision/assistance  ___ Bathe/dress independently ___ Walk with walker     _x__ Bathe/dress with assistance ___ Walk Independently    ___ Shower independently ___ Walk with assistance    ___ Shower with assistance ___ No alcohol     ___ Return to work/school ________  Special Instructions:     COMMUNITY REFERRALS UPON DISCHARGE:    Home Health:   PT & RN   Agency:[facility] Phone:[phone]  Date of last service:11/28/2016  Medical Equipment/Items Ordered:HOSPITAL BED,WHEELCHAIR, WIDE DROP-ARM BEDSIDE COMMODE AND 30 TRANSFER BOARD  Agency/Supplier:[facility]    [phone]   My questions have been answered and I understand these instructions. I will adhere to these goals and the provided educational materials after my discharge from the hospital.  Patient/Caregiver Signature _______________________________ Date __________  Clinician Signature _______________________________________ Date __________  Please bring this form and your medication list with you to all your follow-up doctor's appointments.

## 2016-11-27 NOTE — Plan of Care (Signed)
  Progressing RH BOWEL ELIMINATION RH STG MANAGE BOWEL WITH ASSISTANCE Description STG Manage Bowel with Min Assistance.  11/27/2016 1806 - Progressing by Alfonso RamusEvans, Hailly Fess S, RN RH STG MANAGE BOWEL W/MEDICATION W/ASSISTANCE Description STG Manage Bowel with Medication and Min Assistance.  11/27/2016 1806 - Progressing by Alfonso RamusEvans, Laneshia Pina S, RN RH STG MANAGE BOWEL W/EQUIPMENT W/ASSISTANCE Description STG Manage Bowel With Equipment and Min Assistance  11/27/2016 1806 - Progressing by Alfonso RamusEvans, Giovanni Bath S, RN RH SKIN INTEGRITY RH STG SKIN FREE OF INFECTION/BREAKDOWN Description Free of breakdown during stay on CIPR  11/27/2016 1806 - Progressing by Alfonso RamusEvans, Kili Gracy S, RN RH STG MAINTAIN SKIN INTEGRITY WITH ASSISTANCE Description STG Maintain Skin Integrity With Min Assistance.  11/27/2016 1806 - Progressing by Alfonso RamusEvans, Kyndel Egger S, RN RH STG ABLE TO PERFORM INCISION/WOUND CARE W/ASSISTANCE Description STG Able To Perform Incision/Wound Care With Min Assistance with wife assistance.  11/27/2016 1806 - Progressing by Alfonso RamusEvans, Ercelle Winkles S, RN RH SAFETY RH STG ADHERE TO SAFETY PRECAUTIONS W/ASSISTANCE/DEVICE Description STG Adhere to Safety Precautions With Min Assistance/Device.  11/27/2016 1806 - Progressing by Alfonso RamusEvans, Tyrees Chopin S, RN RH STG DECREASED RISK OF FALL WITH ASSISTANCE Description STG Decreased Risk of Fall With MIn Assistance.  11/27/2016 1806 - Progressing by Alfonso RamusEvans, Aliceson Dolbow S, RN RH PAIN MANAGEMENT RH STG PAIN MANAGED AT OR BELOW PT'S PAIN GOAL Description Pain less than 4 on 1-10 scale  11/27/2016 1806 - Progressing by Alfonso RamusEvans, Delonda Coley S, RN RH KNOWLEDGE DEFICIT GENERAL RH STG INCREASE KNOWLEDGE OF SELF CARE AFTER HOSPITALIZATION Description Pt will demonstrate increased knowledge at discharge with min assistance/cues  11/27/2016 1806 - Progressing by Alfonso RamusEvans, Dharma Pare S, RN
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
## 2016-11-27 NOTE — Progress Notes (Signed)
Social Work Patient ID: Evan Landry, male   DOB: 02-19-1985, 31 y.o.   MRN: [id number]  Met with pt and Mom to discuss team conference progression toward his goals and discharge form tomorrow. [facility] has contacted his wife to deliver equipment between 12-3 tomorrow. He feels ready to go home and family has been trained. Mom voiced it will be good to get home. See in am for any last minute questions.

## 2016-11-27 NOTE — Progress Notes (Signed)
Occupational Therapy Session Note  Patient Details  Name: Mercie Eonhomas E Moser MRN: [id number] Date of Birth: 08/12/1985  Today's Date: 11/27/2016 OT Individual Time: 0800-0917 OT Individual Time Calculation (min): 77 min    Short Term Goals: Week 2:  OT Short Term Goal 1 (Week 2): Continue working toward established LTGs   Skilled Therapeutic Interventions/Progress Updates:    Pt completed bathing and dressing sitting on EOB per pt choice, since he showered yesterday.  He was able to perform all bathing with supervision using the LH sponge and lateral leans for washing peri area.  Min assist for LB dressing in order to pull his pants up over his hips on the left side.  Sockaide was utilized for Manufacturing systems engineerdonning gripper socks with supervision once therapist assisted with TEDs.  He was able to transfer to the wheelchair with mod assist using the sliding board and complete oral hygiene at the sink with modified independence.  Finished session with transition down to the ADL apartment for practice on transferring to the couch and back to the chair.  Min assist for downhill transfer to the couch with max assist for transfer up hill.  Pt returned to room and remained up in wheelchair with call button and phone in reach.    Therapy Documentation Precautions:  Precautions Precautions: Fall Required Braces or Orthoses: Other Brace/Splint Other Brace/Splint: Bilateral hands splints to be worn at all times with exception of ADLs/dressing changes Restrictions Weight Bearing Restrictions: Yes RUE Weight Bearing: Weight bear through elbow only LUE Weight Bearing: Weight bear through elbow only RLE Weight Bearing: Non weight bearing LLE Weight Bearing: Non weight bearing  Pain: Pain Assessment Pain Assessment: No/denies pain ADL: See Function Navigator for Current Functional Status.   Therapy/Group: Individual Therapy  Ivorie Uplinger OTR/L 11/27/2016, 12:31 PM

## 2016-11-28 MED ORDER — METOCLOPRAMIDE HCL 5 MG PO TABS: 5.0000 mg | ORAL_TABLET | Freq: Two times a day (BID) | ORAL | 0 refills | Status: DC

## 2016-11-28 MED ORDER — POLYETHYLENE GLYCOL 3350 17 G PO PACK: 17.0000 g | PACK | Freq: Two times a day (BID) | ORAL | 0 refills | Status: DC

## 2016-11-28 MED ORDER — DOCUSATE SODIUM 100 MG PO CAPS: 100.0000 mg | ORAL_CAPSULE | Freq: Two times a day (BID) | ORAL | 0 refills | Status: AC

## 2016-11-28 MED ORDER — PANTOPRAZOLE SODIUM 40 MG PO TBEC: 40.0000 mg | DELAYED_RELEASE_TABLET | Freq: Every day | ORAL | 0 refills | Status: AC

## 2016-11-28 MED ORDER — METOPROLOL TARTRATE 25 MG PO TABS: 25.0000 mg | ORAL_TABLET | Freq: Two times a day (BID) | ORAL | 0 refills | Status: DC

## 2016-11-28 MED ORDER — OXYCODONE HCL 5 MG PO TABS: 5.0000 mg | ORAL_TABLET | ORAL | 0 refills | Status: DC | PRN

## 2016-11-28 MED ORDER — BACLOFEN 10 MG PO TABS: 10.0000 mg | ORAL_TABLET | Freq: Three times a day (TID) | ORAL | 0 refills | Status: DC | PRN

## 2016-11-28 NOTE — Progress Notes (Signed)
Pt completed discharge education with patient and all questions were answered. Pt received all equipment and is ready for discharge to home with wife.

## 2016-11-28 NOTE — Plan of Care (Signed)
  Progressing RH BOWEL ELIMINATION RH STG MANAGE BOWEL W/MEDICATION W/ASSISTANCE Description STG Manage Bowel with Medication and Min Assistance.  11/28/2016 0259 - Progressing by [name], Tiphanie Vo M, RN RH STG MANAGE BOWEL W/EQUIPMENT W/ASSISTANCE Description STG Manage Bowel With Equipment and Min Assistance  11/28/2016 0259 - Progressing by [name], Garrett Mitchum M, RN RH SKIN INTEGRITY RH STG SKIN FREE OF INFECTION/BREAKDOWN Description Free of breakdown during stay on CIPR  11/28/2016 0259 - Progressing by [name], Suhayla Chisom M, RN RH STG MAINTAIN SKIN INTEGRITY WITH ASSISTANCE Description STG Maintain Skin Integrity With Min Assistance.  11/28/2016 0259 - Progressing by [name], Doralene Glanz M, RN RH SAFETY RH STG ADHERE TO SAFETY PRECAUTIONS W/ASSISTANCE/DEVICE Description STG Adhere to Safety Precautions With Min Assistance/Device.  11/28/2016 0259 - Progressing by [name], Jabarie Pop M, RN RH STG DECREASED RISK OF FALL WITH ASSISTANCE Description STG Decreased Risk of Fall With MIn Assistance.  11/28/2016 0259 - Progressing by [name], Maddeline Roorda M, RN RH PAIN MANAGEMENT RH STG PAIN MANAGED AT OR BELOW PT'S PAIN GOAL Description Pain less than 4 on 1-10 scale  11/28/2016 0259 - Progressing by [name], Marylynne Keelin M, RN RH KNOWLEDGE DEFICIT GENERAL RH STG INCREASE KNOWLEDGE OF SELF CARE AFTER HOSPITALIZATION Description Pt will demonstrate increased knowledge at discharge with min assistance/cues  11/28/2016 0259 - Progressing by [name], Raekwan Spelman M, RN
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
## 2016-11-28 NOTE — Progress Notes (Signed)
Social Work  Discharge Note  The overall goal for the admission was met for:   Discharge location: Yes-HOME WITH FAMILY WHO CAN PROVIDE 24 HR CARE  Length of Stay: Yes-12 DAYS  Discharge activity level: Yes-MIN-SOME MOD ASSIST  Home/community participation: Yes  Services provided included: MD, RD, PT, OT, RN, CM, TR, Pharmacy and SW  Financial Services: Private Insurance: [place]  Follow-up services arranged: Home Health: [place], DME: [place], WIDE DROP-ARM BEDSIDE COMMODE & [address] and Patient/Family has no preference for HH/DME agencies  Comments (or additional information):WIFE AND MOM [place]. PT LIMITED DUE TO WB ISSUES.   Patient/Family verbalized understanding of follow-up arrangements: Yes  Individual responsible for coordination of the follow-up plan: SELF, MOM AND WIFE-ANNA  Confirmed correct DME delivered: Elease Hashimoto 11/28/2016    Elease Hashimoto

## 2016-11-29 ENCOUNTER — Encounter (HOSPITAL_COMMUNITY): Payer: Self-pay | Admitting: Orthopedic Surgery

## 2016-11-29 MED ORDER — ENOXAPARIN SODIUM 40 MG/0.4ML ~~LOC~~ SOLN: 40.0000 mg | SUBCUTANEOUS | 1 refills | Status: AC

## 2016-11-29 NOTE — Progress Notes (Signed)
At time of patient's discharge subcutaneous Lovenox had  been arranged for discharge until weightbearing could be advanced at 40 mg daily. Venous Doppler studies have been negative.

## 2016-12-02 ENCOUNTER — Other Ambulatory Visit: Payer: Self-pay

## 2016-12-02 ENCOUNTER — Inpatient Hospital Stay (HOSPITAL_COMMUNITY)
Admission: EM | Admit: 2016-12-02 | Discharge: 2016-12-10 | DRG: 331 | Disposition: A | Discharge status: Home / Self Care | Payer: BLUE CROSS/BLUE SHIELD | Attending: General Surgery | Admitting: General Surgery

## 2016-12-02 ENCOUNTER — Emergency Department (HOSPITAL_COMMUNITY): Payer: BLUE CROSS/BLUE SHIELD

## 2016-12-02 ENCOUNTER — Encounter (HOSPITAL_COMMUNITY): Payer: Self-pay | Admitting: Cardiology

## 2016-12-02 DIAGNOSIS — K631 Perforation of intestine (nontraumatic): Secondary | ICD-10-CM

## 2016-12-02 DIAGNOSIS — S36439A Laceration of unspecified part of small intestine, initial encounter: Secondary | ICD-10-CM | POA: Diagnosis present

## 2016-12-02 DIAGNOSIS — K56609 Unspecified intestinal obstruction, unspecified as to partial versus complete obstruction: Secondary | ICD-10-CM | POA: Diagnosis present

## 2016-12-02 DIAGNOSIS — K565 Intestinal adhesions [bands], unspecified as to partial versus complete obstruction: Secondary | ICD-10-CM | POA: Diagnosis not present

## 2016-12-02 DIAGNOSIS — Z833 Family history of diabetes mellitus: Secondary | ICD-10-CM

## 2016-12-02 DIAGNOSIS — Z8249 Family history of ischemic heart disease and other diseases of the circulatory system: Secondary | ICD-10-CM

## 2016-12-02 DIAGNOSIS — R748 Abnormal levels of other serum enzymes: Secondary | ICD-10-CM | POA: Diagnosis not present

## 2016-12-02 DIAGNOSIS — R000 Tachycardia, unspecified: Secondary | ICD-10-CM | POA: Diagnosis present

## 2016-12-02 DIAGNOSIS — K56699 Other intestinal obstruction unspecified as to partial versus complete obstruction: Secondary | ICD-10-CM | POA: Diagnosis not present

## 2016-12-02 DIAGNOSIS — K660 Peritoneal adhesions (postprocedural) (postinfection): Secondary | ICD-10-CM | POA: Diagnosis present

## 2016-12-02 DIAGNOSIS — R1013 Epigastric pain: Secondary | ICD-10-CM | POA: Diagnosis present

## 2016-12-02 DIAGNOSIS — D72829 Elevated white blood cell count, unspecified: Secondary | ICD-10-CM | POA: Diagnosis present

## 2016-12-02 DIAGNOSIS — Z880 Allergy status to penicillin: Secondary | ICD-10-CM

## 2016-12-02 DIAGNOSIS — E559 Vitamin D deficiency, unspecified: Secondary | ICD-10-CM | POA: Diagnosis not present

## 2016-12-02 DIAGNOSIS — R1010 Upper abdominal pain, unspecified: Secondary | ICD-10-CM

## 2016-12-02 HISTORY — DX: Rider (driver) (passenger) of other motorcycle injured in unspecified traffic accident, initial encounter: V29.99XA

## 2016-12-02 LAB — URINALYSIS, ROUTINE W REFLEX MICROSCOPIC
Bilirubin Urine: NEGATIVE
Glucose, UA: NEGATIVE mg/dL
Hgb urine dipstick: NEGATIVE
KETONES UR: NEGATIVE mg/dL
LEUKOCYTES UA: NEGATIVE
NITRITE: NEGATIVE
PH: 5 (ref 5.0–8.0)
PROTEIN: NEGATIVE mg/dL
Specific Gravity, Urine: 1.015 (ref 1.005–1.030)
Squamous Epithelial / LPF: NONE SEEN

## 2016-12-02 LAB — DIFFERENTIAL
Basophils Absolute: 0 10*3/uL (ref 0.0–0.1)
Basophils Relative: 0 %
EOS PCT: 1 %
Eosinophils Absolute: 0.2 10*3/uL (ref 0.0–0.7)
LYMPHS PCT: 12 %
Lymphs Abs: 1.7 10*3/uL (ref 0.7–4.0)
MONO ABS: 0.7 10*3/uL (ref 0.1–1.0)
MONOS PCT: 5 %
NEUTROS ABS: 11 10*3/uL — AB (ref 1.7–7.7)
Neutrophils Relative %: 82 %

## 2016-12-02 LAB — COMPREHENSIVE METABOLIC PANEL
ALT: 49 U/L (ref 17–63)
AST: 23 U/L (ref 15–41)
Albumin: 4.2 g/dL (ref 3.5–5.0)
Alkaline Phosphatase: 275 U/L — ABNORMAL HIGH (ref 38–126)
Anion gap: 10 (ref 5–15)
BILIRUBIN TOTAL: 1 mg/dL (ref 0.3–1.2)
BUN: 15 mg/dL (ref 6–20)
CO2: 27 mmol/L (ref 22–32)
CREATININE: 0.83 mg/dL (ref 0.61–1.24)
Calcium: 10.9 mg/dL — ABNORMAL HIGH (ref 8.9–10.3)
Chloride: 101 mmol/L (ref 101–111)
Glucose, Bld: 113 mg/dL — ABNORMAL HIGH (ref 65–99)
Potassium: 4.3 mmol/L (ref 3.5–5.1)
SODIUM: 138 mmol/L (ref 135–145)
TOTAL PROTEIN: 9.9 g/dL — AB (ref 6.5–8.1)

## 2016-12-02 LAB — CBC
HCT: 43.5 % (ref 39.0–52.0)
Hemoglobin: 14.7 g/dL (ref 13.0–17.0)
MCH: 29.3 pg (ref 26.0–34.0)
MCHC: 33.8 g/dL (ref 30.0–36.0)
MCV: 86.8 fL (ref 78.0–100.0)
Platelets: 363 10*3/uL (ref 150–400)
RBC: 5.01 MIL/uL (ref 4.22–5.81)
RDW: 13.6 % (ref 11.5–15.5)
WBC: 14.3 10*3/uL — AB (ref 4.0–10.5)

## 2016-12-02 LAB — LIPASE, BLOOD: LIPASE: 26 U/L (ref 11–51)

## 2016-12-02 MED ORDER — IOPAMIDOL (ISOVUE-300) INJECTION 61%
100.0000 mL | Freq: Once | INTRAVENOUS | Status: AC | PRN
  Administered 2016-12-02: 100 mL via INTRAVENOUS

## 2016-12-02 MED ORDER — PANTOPRAZOLE SODIUM 40 MG IV SOLR
40.0000 mg | Freq: Once | INTRAVENOUS | Status: AC
  Administered 2016-12-02: 40 mg via INTRAVENOUS
  Filled 2016-12-02: qty 40

## 2016-12-02 MED ORDER — SODIUM CHLORIDE 0.9 % IV SOLN
INTRAVENOUS | Status: DC
  Administered 2016-12-02: 23:00:00 via INTRAVENOUS

## 2016-12-02 MED ORDER — HYDROMORPHONE HCL 1 MG/ML IJ SOLN
0.5000 mg | Freq: Once | INTRAMUSCULAR | Status: AC
  Administered 2016-12-02: 0.5 mg via INTRAVENOUS
  Filled 2016-12-02: qty 1
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
## 2016-12-02 NOTE — ED Notes (Signed)
Pt vomited x 1 after pain medication, states feels better, offered zofran to pt and pt refuses at this time

## 2016-12-02 NOTE — ED Notes (Signed)
EDP aware of pt's request for pain med

## 2016-12-03 ENCOUNTER — Inpatient Hospital Stay (HOSPITAL_COMMUNITY): Payer: BLUE CROSS/BLUE SHIELD

## 2016-12-03 LAB — CBC
HCT: 38.6 % — ABNORMAL LOW (ref 39.0–52.0)
Hemoglobin: 12.9 g/dL — ABNORMAL LOW (ref 13.0–17.0)
MCH: 28.9 pg (ref 26.0–34.0)
MCHC: 33.4 g/dL (ref 30.0–36.0)
MCV: 86.4 fL (ref 78.0–100.0)
PLATELETS: 297 10*3/uL (ref 150–400)
RBC: 4.47 MIL/uL (ref 4.22–5.81)
RDW: 13.5 % (ref 11.5–15.5)
WBC: 10.8 10*3/uL — ABNORMAL HIGH (ref 4.0–10.5)

## 2016-12-03 LAB — COMPREHENSIVE METABOLIC PANEL
ALT: 40 U/L (ref 17–63)
AST: 17 U/L (ref 15–41)
Albumin: 3.5 g/dL (ref 3.5–5.0)
Alkaline Phosphatase: 229 U/L — ABNORMAL HIGH (ref 38–126)
Anion gap: 9 (ref 5–15)
BUN: 14 mg/dL (ref 6–20)
CHLORIDE: 102 mmol/L (ref 101–111)
CO2: 25 mmol/L (ref 22–32)
CREATININE: 0.69 mg/dL (ref 0.61–1.24)
Calcium: 9.5 mg/dL (ref 8.9–10.3)
GFR calc non Af Amer: >60 mL/min (ref >60)
Glucose, Bld: 96 mg/dL (ref 65–99)
Potassium: 4 mmol/L (ref 3.5–5.1)
SODIUM: 136 mmol/L (ref 135–145)
Total Bilirubin: 1.1 mg/dL (ref 0.3–1.2)
Total Protein: 8.2 g/dL — ABNORMAL HIGH (ref 6.5–8.1)

## 2016-12-03 LAB — TROPONIN I

## 2016-12-03 LAB — TSH: TSH: 1.209 u[IU]/mL (ref 0.350–4.500)

## 2016-12-03 LAB — D-DIMER, QUANTITATIVE: D-Dimer, Quant: 2.17 ug/mL-FEU — ABNORMAL HIGH (ref 0.00–0.50)

## 2016-12-03 LAB — PHOSPHORUS: Phosphorus: 4.6 mg/dL (ref 2.5–4.6)

## 2016-12-03 LAB — MAGNESIUM: Magnesium: 1.8 mg/dL (ref 1.7–2.4)

## 2016-12-03 MED ORDER — HYDROMORPHONE HCL 1 MG/ML IJ SOLN
0.5000 mg | INTRAMUSCULAR | Status: DC | PRN
  Administered 2016-12-06: 0.5 mg via INTRAVENOUS
  Filled 2016-12-03 (×3): qty 1

## 2016-12-03 MED ORDER — OXYCODONE HCL 5 MG PO TABS: 5.0000 mg | ORAL_TABLET | ORAL | Status: DC | PRN

## 2016-12-03 MED ORDER — METOPROLOL TARTRATE 25 MG PO TABS
25.0000 mg | ORAL_TABLET | Freq: Two times a day (BID) | ORAL | Status: DC
  Administered 2016-12-03: 25 mg via ORAL
  Filled 2016-12-03: qty 1

## 2016-12-03 MED ORDER — DOCUSATE SODIUM 100 MG PO CAPS: 100.0000 mg | ORAL_CAPSULE | Freq: Two times a day (BID) | ORAL | Status: DC

## 2016-12-03 MED ORDER — ENOXAPARIN SODIUM 40 MG/0.4ML ~~LOC~~ SOLN
40.0000 mg | SUBCUTANEOUS | Status: DC
  Administered 2016-12-03 – 2016-12-09 (×7): 40 mg via SUBCUTANEOUS
  Filled 2016-12-03 (×8): qty 0.4

## 2016-12-03 MED ORDER — SODIUM CHLORIDE 0.9 % IV SOLN
INTRAVENOUS | Status: AC
  Administered 2016-12-03 (×2): via INTRAVENOUS

## 2016-12-03 MED ORDER — ACETAMINOPHEN 325 MG PO TABS: 650.0000 mg | ORAL_TABLET | Freq: Four times a day (QID) | ORAL | Status: DC | PRN

## 2016-12-03 MED ORDER — IOPAMIDOL (ISOVUE-300) INJECTION 61%
INTRAVENOUS | Status: AC
  Filled 2016-12-03: qty 30

## 2016-12-03 MED ORDER — ACETAMINOPHEN 650 MG RE SUPP: 650.0000 mg | Freq: Four times a day (QID) | RECTAL | Status: DC | PRN

## 2016-12-03 MED ORDER — IOPAMIDOL (ISOVUE-300) INJECTION 61%
100.0000 mL | Freq: Once | INTRAVENOUS | Status: AC | PRN
  Administered 2016-12-03: 100 mL via INTRAVENOUS

## 2016-12-03 MED ORDER — POLYETHYLENE GLYCOL 3350 17 G PO PACK: 17.0000 g | PACK | Freq: Two times a day (BID) | ORAL | Status: DC

## 2016-12-03 MED ORDER — ONDANSETRON HCL 4 MG/2ML IJ SOLN
4.0000 mg | Freq: Four times a day (QID) | INTRAMUSCULAR | Status: DC | PRN
  Administered 2016-12-03 – 2016-12-04 (×3): 4 mg via INTRAVENOUS
  Filled 2016-12-03 (×4): qty 2

## 2016-12-03 MED ORDER — BACLOFEN 10 MG PO TABS: 10.0000 mg | ORAL_TABLET | Freq: Three times a day (TID) | ORAL | Status: DC | PRN

## 2016-12-03 MED ORDER — PANTOPRAZOLE SODIUM 40 MG PO TBEC: 40.0000 mg | DELAYED_RELEASE_TABLET | Freq: Every day | ORAL | Status: DC

## 2016-12-03 NOTE — ED Notes (Signed)
Dr. Bridges at bedside 

## 2016-12-03 NOTE — ED Notes (Signed)
Pt has drank one and half bottles of oral contrast so far.
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
## 2016-12-03 NOTE — ED Notes (Signed)
Pt's significant other informed this nurse that pt had finished oral contrast.
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
## 2016-12-03 NOTE — Progress Notes (Signed)
[facility]  CT done. SBO with transition point with contrast moving past the point in the left hemipelvis where this area is located / scar/ adhesions.   Going to discuss the case with other surgeons Minerva Fester/trauma providers.  Updated patient.  Will discuss more tomorrow.  Algis GreenhouseLindsay Nyle Limb, MD [facility] [address] [place] [id number] [phone] (office)
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
## 2016-12-04 DIAGNOSIS — K56699 Other intestinal obstruction unspecified as to partial versus complete obstruction: Secondary | ICD-10-CM

## 2016-12-04 DIAGNOSIS — K565 Intestinal adhesions [bands], unspecified as to partial versus complete obstruction: Secondary | ICD-10-CM

## 2016-12-04 LAB — COMPREHENSIVE METABOLIC PANEL
ALBUMIN: 3 g/dL — AB (ref 3.5–5.0)
ALK PHOS: 190 U/L — AB (ref 38–126)
ALT: 27 U/L (ref 17–63)
AST: 14 U/L — ABNORMAL LOW (ref 15–41)
Anion gap: 11 (ref 5–15)
BILIRUBIN TOTAL: 1.2 mg/dL (ref 0.3–1.2)
BUN: 11 mg/dL (ref 6–20)
CO2: 24 mmol/L (ref 22–32)
CREATININE: 0.64 mg/dL (ref 0.61–1.24)
Calcium: 9.3 mg/dL (ref 8.9–10.3)
Chloride: 102 mmol/L (ref 101–111)
GFR calc Af Amer: >60 mL/min (ref >60)
GLUCOSE: 77 mg/dL (ref 65–99)
Potassium: 3.6 mmol/L (ref 3.5–5.1)
Sodium: 137 mmol/L (ref 135–145)
TOTAL PROTEIN: 7 g/dL (ref 6.5–8.1)

## 2016-12-04 LAB — PROTEIN ELECTROPHORESIS, SERUM
A/G RATIO SPE: 0.7 (ref 0.7–1.7)
ALPHA-1-GLOBULIN: 0.3 g/dL (ref 0.0–0.4)
ALPHA-2-GLOBULIN: 0.8 g/dL (ref 0.4–1.0)
Albumin ELP: 3.2 g/dL (ref 2.9–4.4)
BETA GLOBULIN: 1.3 g/dL (ref 0.7–1.3)
Gamma Globulin: 2.3 g/dL — ABNORMAL HIGH (ref 0.4–1.8)
Globulin, Total: 4.8 g/dL — ABNORMAL HIGH (ref 2.2–3.9)
Total Protein ELP: 8 g/dL (ref 6.0–8.5)

## 2016-12-04 LAB — CBC
HEMATOCRIT: 35.2 % — AB (ref 39.0–52.0)
HEMOGLOBIN: 11.5 g/dL — AB (ref 13.0–17.0)
MCH: 28.5 pg (ref 26.0–34.0)
MCHC: 32.7 g/dL (ref 30.0–36.0)
MCV: 87.3 fL (ref 78.0–100.0)
Platelets: 251 10*3/uL (ref 150–400)
RBC: 4.03 MIL/uL — AB (ref 4.22–5.81)
RDW: 13.3 % (ref 11.5–15.5)
WBC: 6.8 10*3/uL (ref 4.0–10.5)

## 2016-12-04 LAB — VITAMIN D 25 HYDROXY (VIT D DEFICIENCY, FRACTURES): Vit D, 25-Hydroxy: 22.4 ng/mL — ABNORMAL LOW (ref 30.0–100.0)

## 2016-12-04 LAB — SURGICAL PCR SCREEN
MRSA, PCR: NEGATIVE
STAPHYLOCOCCUS AUREUS: POSITIVE — AB

## 2016-12-04 LAB — PARATHYROID HORMONE, INTACT (NO CA): PTH: 10 pg/mL — ABNORMAL LOW (ref 15–65)

## 2016-12-04 MED ORDER — ZOLPIDEM TARTRATE 5 MG PO TABS
5.0000 mg | ORAL_TABLET | Freq: Once | ORAL | Status: AC
  Administered 2016-12-04: 5 mg via ORAL
  Filled 2016-12-04: qty 1

## 2016-12-04 MED ORDER — CHLORHEXIDINE GLUCONATE CLOTH 2 % EX PADS
6.0000 | MEDICATED_PAD | Freq: Once | CUTANEOUS | Status: AC
  Administered 2016-12-05: 6 via TOPICAL

## 2016-12-04 MED ORDER — POTASSIUM CHLORIDE IN NACL 20-0.9 MEQ/L-% IV SOLN
INTRAVENOUS | Status: DC
  Administered 2016-12-04 – 2016-12-09 (×9): via INTRAVENOUS

## 2016-12-04 MED ORDER — CHLORHEXIDINE GLUCONATE CLOTH 2 % EX PADS
6.0000 | MEDICATED_PAD | Freq: Once | CUTANEOUS | Status: AC
  Administered 2016-12-06: 6 via TOPICAL

## 2016-12-04 MED ORDER — PHENOL 1.4 % MT LIQD
1.0000 | OROMUCOSAL | Status: DC | PRN
  Administered 2016-12-04: 1 via OROMUCOSAL
  Filled 2016-12-04: qty 177

## 2016-12-04 NOTE — Progress Notes (Signed)
[facility]  Spoke with patient, family, and Dr. Butler Denmarkizwan regarding the patient's CT findings and concerns. Have discussed case with Dr. Lovell SheehanJenkins, Dr. Lindie [facility]Wyatt [facility]), and other trauma surgeons that I know.  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  This will not improve with bowel rest alone.  Plan for OR Friday for Ex lap, bowel resection. Laparoscopic approach would be more dangerous given bowel distention. NG in place for now, can have sips/ chips for comfort with NG to suction. IVF  Algis GreenhouseLindsay Bridges, MD [facility] [address] [place], [place] [phone] (office)
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
## 2016-12-05 LAB — BASIC METABOLIC PANEL
ANION GAP: 7 (ref 5–15)
BUN: 5 mg/dL — ABNORMAL LOW (ref 6–20)
CALCIUM: 9 mg/dL (ref 8.9–10.3)
CHLORIDE: 103 mmol/L (ref 101–111)
CO2: 27 mmol/L (ref 22–32)
Creatinine, Ser: 0.55 mg/dL — ABNORMAL LOW (ref 0.61–1.24)
GFR calc non Af Amer: >60 mL/min (ref >60)
Glucose, Bld: 98 mg/dL (ref 65–99)
Potassium: 3.7 mmol/L (ref 3.5–5.1)
Sodium: 137 mmol/L (ref 135–145)

## 2016-12-05 MED ORDER — ZOLPIDEM TARTRATE 5 MG PO TABS
5.0000 mg | ORAL_TABLET | Freq: Once | ORAL | Status: AC
  Administered 2016-12-06: 5 mg via ORAL
  Filled 2016-12-05: qty 1

## 2016-12-05 MED ORDER — CHLORHEXIDINE GLUCONATE CLOTH 2 % EX PADS: 6.0000 | MEDICATED_PAD | Freq: Every day | CUTANEOUS | Status: DC

## 2016-12-05 MED ORDER — MUPIROCIN 2 % EX OINT
TOPICAL_OINTMENT | Freq: Two times a day (BID) | CUTANEOUS | Status: DC
  Administered 2016-12-05 (×2): via NASAL
  Filled 2016-12-05: qty 22

## 2016-12-05 NOTE — Progress Notes (Signed)
[facility]  Patient Status: Active (receiving services up to time of hospitalization)  [facility] is providing the following services: RN, PT and OT  If patient discharges after hours, please call [phone].   Alroy BailiffLinda Lothian RN 12/05/2016, 10:05 AM
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
## 2016-12-05 NOTE — Progress Notes (Signed)
After speaking with dr. Toniann FailKakrakandy, new order for Ambien 5mg  once. Pt educated

## 2016-12-05 NOTE — Plan of Care (Signed)
  Acute Rehab PT Goals(only PT should resolve) Pt will Roll Supine to Side 12/05/2016 1218 by Ocie BobWatkins, Verma Grothaus, PT Flowsheets Taken 12/05/2016 1218  Pt will Roll Supine to Side with modified independence Pt Will Go Supine/Side To Sit 12/05/2016 1218 by Ocie BobWatkins, Byren Pankow, PT Flowsheets Taken 12/05/2016 1218  Pt will go Supine/Side to Sit with min guard assist Pt Will Go Sit To Supine/Side 12/05/2016 1218 by Ocie BobWatkins, Elnoria Livingston, PT Flowsheets Taken 12/05/2016 1218  Pt will go Sit to Supine/Side with min guard assist Pt Will Transfer Bed To Chair/Chair To Bed 12/05/2016 1218 by Ocie BobWatkins, Baleria Wyman, PT Flowsheets Taken 12/05/2016 1218  Pt will Transfer Bed to Chair/Chair to Bed with mod assist Note Using sliding board  12:20 PM, 12/05/16 Ocie BobJames Kemonie Cutillo, MPT Physical Therapist with [facility] 336 [phone] office [phone] mobile phone

## 2016-12-05 NOTE — Progress Notes (Signed)
Pt requesting Ambien tonight since he received a one time dose last night.Dr. Toniann FailKakrakandy paged and made aware. Waiting for orders
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
## 2016-12-06 ENCOUNTER — Inpatient Hospital Stay (HOSPITAL_COMMUNITY): Payer: BLUE CROSS/BLUE SHIELD | Admitting: Anesthesiology

## 2016-12-06 ENCOUNTER — Encounter (HOSPITAL_COMMUNITY)
Admission: EM | Disposition: A | Discharge status: Home / Self Care | Payer: Self-pay | Source: Home / Self Care | Attending: General Surgery

## 2016-12-06 DIAGNOSIS — K631 Perforation of intestine (nontraumatic): Secondary | ICD-10-CM

## 2016-12-06 HISTORY — PX: LYSIS OF ADHESION: SHX5961

## 2016-12-06 HISTORY — PX: BOWEL RESECTION: SHX1257

## 2016-12-06 HISTORY — PX: LAPAROTOMY: SHX154

## 2016-12-06 SURGERY — LAPAROTOMY, EXPLORATORY
Anesthesia: General | Site: Abdomen

## 2016-12-06 MED ORDER — BUPIVACAINE LIPOSOME 1.3 % IJ SUSP
INTRAMUSCULAR | Status: DC | PRN
  Administered 2016-12-06: 20 mL

## 2016-12-06 MED ORDER — OXYCODONE HCL 5 MG PO TABS
5.0000 mg | ORAL_TABLET | ORAL | Status: DC | PRN
  Administered 2016-12-07: 5 mg via ORAL
  Administered 2016-12-07 (×2): 10 mg via ORAL
  Administered 2016-12-07: 5 mg via ORAL
  Administered 2016-12-07 – 2016-12-10 (×7): 10 mg via ORAL
  Filled 2016-12-06 (×3): qty 2
  Filled 2016-12-06: qty 1
  Filled 2016-12-06 (×6): qty 2
  Filled 2016-12-06: qty 1
  Filled 2016-12-06 (×2): qty 2

## 2016-12-06 MED ORDER — HYDROMORPHONE HCL 1 MG/ML IJ SOLN
0.5000 mg | INTRAMUSCULAR | Status: DC | PRN
  Administered 2016-12-06 – 2016-12-09 (×8): 0.5 mg via INTRAVENOUS
  Filled 2016-12-06 (×8): qty 1

## 2016-12-06 MED ORDER — HYDROMORPHONE HCL 1 MG/ML IJ SOLN
0.2500 mg | INTRAMUSCULAR | Status: DC | PRN
  Administered 2016-12-06 (×4): 0.5 mg via INTRAVENOUS
  Filled 2016-12-06 (×2): qty 1

## 2016-12-06 MED ORDER — LIDOCAINE HCL (CARDIAC) 10 MG/ML IV SOLN
INTRAVENOUS | Status: DC | PRN
  Administered 2016-12-06: 50 mg via INTRAVENOUS

## 2016-12-06 MED ORDER — BUPIVACAINE LIPOSOME 1.3 % IJ SUSP
INTRAMUSCULAR | Status: AC
  Filled 2016-12-06: qty 20

## 2016-12-06 MED ORDER — SUCCINYLCHOLINE CHLORIDE 20 MG/ML IJ SOLN
INTRAMUSCULAR | Status: DC | PRN
  Administered 2016-12-06: 100 mg via INTRAVENOUS

## 2016-12-06 MED ORDER — ZOLPIDEM TARTRATE 5 MG PO TABS
5.0000 mg | ORAL_TABLET | Freq: Once | ORAL | Status: AC
  Administered 2016-12-06: 5 mg via ORAL
  Filled 2016-12-06: qty 1

## 2016-12-06 MED ORDER — DIPHENHYDRAMINE HCL 50 MG/ML IJ SOLN: 12.5000 mg | Freq: Four times a day (QID) | INTRAMUSCULAR | Status: DC | PRN

## 2016-12-06 MED ORDER — POVIDONE-IODINE 10 % EX OINT
TOPICAL_OINTMENT | CUTANEOUS | Status: AC
  Filled 2016-12-06: qty 1

## 2016-12-06 MED ORDER — PROPOFOL 10 MG/ML IV BOLUS
INTRAVENOUS | Status: AC
  Filled 2016-12-06: qty 20

## 2016-12-06 MED ORDER — FENTANYL CITRATE (PF) 250 MCG/5ML IJ SOLN
INTRAMUSCULAR | Status: AC
Start: 2016-12-06 — End: 2016-12-06
  Filled 2016-12-06: qty 5

## 2016-12-06 MED ORDER — PROPOFOL 10 MG/ML IV BOLUS
INTRAVENOUS | Status: DC | PRN
  Administered 2016-12-06: 150 mg via INTRAVENOUS
  Administered 2016-12-06: 50 mg via INTRAVENOUS

## 2016-12-06 MED ORDER — DOCUSATE SODIUM 100 MG PO CAPS
100.0000 mg | ORAL_CAPSULE | Freq: Two times a day (BID) | ORAL | Status: DC
  Administered 2016-12-06 – 2016-12-10 (×8): 100 mg via ORAL
  Filled 2016-12-06 (×8): qty 1

## 2016-12-06 MED ORDER — ACETAMINOPHEN 500 MG PO TABS
1000.0000 mg | ORAL_TABLET | Freq: Four times a day (QID) | ORAL | Status: DC
  Administered 2016-12-06 – 2016-12-08 (×6): 1000 mg via ORAL
  Filled 2016-12-06 (×11): qty 2

## 2016-12-06 MED ORDER — CHLORHEXIDINE GLUCONATE CLOTH 2 % EX PADS: 6.0000 | MEDICATED_PAD | Freq: Every day | CUTANEOUS | Status: DC

## 2016-12-06 MED ORDER — SIMETHICONE 80 MG PO CHEW: 40.0000 mg | CHEWABLE_TABLET | Freq: Four times a day (QID) | ORAL | Status: DC | PRN

## 2016-12-06 MED ORDER — MIDAZOLAM HCL 2 MG/2ML IJ SOLN
1.0000 mg | INTRAMUSCULAR | Status: DC
  Administered 2016-12-06: 2 mg via INTRAVENOUS

## 2016-12-06 MED ORDER — LACTATED RINGERS IV SOLN
INTRAVENOUS | Status: DC
  Administered 2016-12-06 (×2): via INTRAVENOUS

## 2016-12-06 MED ORDER — NEOSTIGMINE METHYLSULFATE 10 MG/10ML IV SOLN
INTRAVENOUS | Status: DC | PRN
  Administered 2016-12-06: 1 mg via INTRAVENOUS
  Administered 2016-12-06: 3 mg via INTRAVENOUS

## 2016-12-06 MED ORDER — ONDANSETRON HCL 4 MG/2ML IJ SOLN
4.0000 mg | Freq: Once | INTRAMUSCULAR | Status: AC
  Administered 2016-12-06: 4 mg via INTRAVENOUS

## 2016-12-06 MED ORDER — FENTANYL CITRATE (PF) 100 MCG/2ML IJ SOLN
INTRAMUSCULAR | Status: DC | PRN
  Administered 2016-12-06 (×6): 50 ug via INTRAVENOUS
  Administered 2016-12-06: 100 ug via INTRAVENOUS

## 2016-12-06 MED ORDER — GLYCOPYRROLATE 0.2 MG/ML IJ SOLN
INTRAMUSCULAR | Status: DC | PRN
  Administered 2016-12-06: 0.2 mg via INTRAVENOUS
  Administered 2016-12-06: 0.4 mg via INTRAVENOUS

## 2016-12-06 MED ORDER — DIPHENHYDRAMINE HCL 12.5 MG/5ML PO ELIX: 12.5000 mg | ORAL_SOLUTION | Freq: Four times a day (QID) | ORAL | Status: DC | PRN

## 2016-12-06 MED ORDER — SODIUM CHLORIDE 0.9 % IR SOLN
Status: DC | PRN
  Administered 2016-12-06 (×2): 1000 mL

## 2016-12-06 MED ORDER — ROCURONIUM BROMIDE 100 MG/10ML IV SOLN
INTRAVENOUS | Status: DC | PRN
  Administered 2016-12-06: 5 mg via INTRAVENOUS
  Administered 2016-12-06: 10 mg via INTRAVENOUS
  Administered 2016-12-06: 25 mg via INTRAVENOUS
  Administered 2016-12-06: 10 mg via INTRAVENOUS

## 2016-12-06 MED ORDER — SODIUM CHLORIDE 0.9 % IV SOLN
1.0000 g | INTRAVENOUS | Status: AC
  Administered 2016-12-06: 1 g via INTRAVENOUS
  Filled 2016-12-06: qty 1

## 2016-12-06 MED ORDER — MUPIROCIN 2 % EX OINT
1.0000 "application " | TOPICAL_OINTMENT | Freq: Two times a day (BID) | CUTANEOUS | Status: DC
  Administered 2016-12-06 – 2016-12-10 (×8): 1 via NASAL
  Filled 2016-12-06: qty 22

## 2016-12-06 MED ORDER — MIDAZOLAM HCL 2 MG/2ML IJ SOLN
INTRAMUSCULAR | Status: AC
  Filled 2016-12-06: qty 2

## 2016-12-06 SURGICAL SUPPLY — 64 items
APPLIER CLIP 11 MED OPEN (CLIP)
APPLIER CLIP 13 LRG OPEN (CLIP)
BAG HAMPER (MISCELLANEOUS) ×3 IMPLANT
BARRIER SKIN 2 3/4 (OSTOMY) IMPLANT
CELLS DAT CNTRL 66122 CELL SVR (MISCELLANEOUS) ×2 IMPLANT
CHLORAPREP W/TINT 26ML (MISCELLANEOUS) ×3 IMPLANT
CLAMP POUCH DRAINAGE QUIET (OSTOMY) IMPLANT
CLIP APPLIE 11 MED OPEN (CLIP) IMPLANT
CLIP APPLIE 13 LRG OPEN (CLIP) IMPLANT
CLOTH BEACON ORANGE TIMEOUT ST (SAFETY) ×3 IMPLANT
COVER LIGHT HANDLE STERIS (MISCELLANEOUS) ×6 IMPLANT
DRAPE WARM FLUID 44X44 (DRAPE) ×3 IMPLANT
DRSG OPSITE POSTOP 4X10 (GAUZE/BANDAGES/DRESSINGS) ×3 IMPLANT
DRSG OPSITE POSTOP 4X8 (GAUZE/BANDAGES/DRESSINGS) ×3 IMPLANT
ELECT BLADE 6 FLAT ULTRCLN (ELECTRODE) IMPLANT
ELECT REM PT RETURN 9FT ADLT (ELECTROSURGICAL) ×3
ELECTRODE REM PT RTRN 9FT ADLT (ELECTROSURGICAL) ×2 IMPLANT
GAUZE SPONGE 4X4 12PLY STRL (GAUZE/BANDAGES/DRESSINGS) ×3 IMPLANT
GLOVE BIO SURGEON STRL SZ 6.5 (GLOVE) ×6 IMPLANT
GLOVE BIO SURGEON STRL SZ7 (GLOVE) ×6 IMPLANT
GLOVE BIOGEL PI IND STRL 6.5 (GLOVE) ×4 IMPLANT
GLOVE BIOGEL PI IND STRL 7.0 (GLOVE) ×4 IMPLANT
GLOVE BIOGEL PI IND STRL 7.5 (GLOVE) ×4 IMPLANT
GLOVE BIOGEL PI INDICATOR 6.5 (GLOVE) ×2
GLOVE BIOGEL PI INDICATOR 7.0 (GLOVE) ×2
GLOVE BIOGEL PI INDICATOR 7.5 (GLOVE) ×2
GLOVE SURG SS PI 7.5 STRL IVOR (GLOVE) ×6 IMPLANT
GOWN STRL REUS W/TWL LRG LVL3 (GOWN DISPOSABLE) ×12 IMPLANT
HANDLE SUCTION POOLE (INSTRUMENTS) ×2 IMPLANT
INST SET MAJOR GENERAL (KITS) ×3 IMPLANT
KIT REMOVER STAPLE SKIN (MISCELLANEOUS) IMPLANT
KIT ROOM TURNOVER APOR (KITS) ×3 IMPLANT
LIGASURE IMPACT 36 18CM CVD LR (INSTRUMENTS) ×3 IMPLANT
MANIFOLD NEPTUNE II (INSTRUMENTS) ×3 IMPLANT
NEEDLE HYPO 18GX1.5 BLUNT FILL (NEEDLE) ×3 IMPLANT
NS IRRIG 1000ML POUR BTL (IV SOLUTION) ×6 IMPLANT
PACK ABDOMINAL MAJOR (CUSTOM PROCEDURE TRAY) ×3 IMPLANT
PAD ARMBOARD 7.5X6 YLW CONV (MISCELLANEOUS) ×3 IMPLANT
POUCH OSTOMY 2 3/4  H 3804 (WOUND CARE)
POUCH OSTOMY 2 PC DRNBL 2.75 (WOUND CARE) IMPLANT
RELOAD LINEAR CUT PROX 55 BLUE (ENDOMECHANICALS) ×12 IMPLANT
RELOAD PROXIMATE 75MM BLUE (ENDOMECHANICALS) IMPLANT
RETRACTOR WND ALEXIS 25 LRG (MISCELLANEOUS) IMPLANT
RTRCTR WOUND ALEXIS 18CM MED (MISCELLANEOUS) ×3
RTRCTR WOUND ALEXIS 25CM LRG (MISCELLANEOUS)
SET BASIN LINEN APH (SET/KITS/TRAYS/PACK) ×3 IMPLANT
SPONGE LAP 18X18 X RAY DECT (DISPOSABLE) ×3 IMPLANT
STAPLER GUN LINEAR PROX 60 (STAPLE) ×3 IMPLANT
STAPLER PROXIMATE 55 BLUE (STAPLE) ×3 IMPLANT
STAPLER PROXIMATE 75MM BLUE (STAPLE) IMPLANT
STAPLER VISISTAT (STAPLE) ×3 IMPLANT
SUCTION POOLE HANDLE (INSTRUMENTS) ×3
SUT CHROMIC 0 SH (SUTURE) IMPLANT
SUT CHROMIC 2 0 SH (SUTURE) IMPLANT
SUT CHROMIC 3 0 SH 27 (SUTURE) IMPLANT
SUT NOVA NAB GS-26 0 60 (SUTURE) ×6 IMPLANT
SUT PDS AB 0 CTX 60 (SUTURE) IMPLANT
SUT PDS AB CT VIOLET #0 27IN (SUTURE) ×6 IMPLANT
SUT PROLENE 2 0 SH 30 (SUTURE) ×3 IMPLANT
SUT SILK 2 0 (SUTURE) ×1
SUT SILK 2-0 18XBRD TIE 12 (SUTURE) ×2 IMPLANT
SUT SILK 3 0 SH CR/8 (SUTURE) ×3 IMPLANT
SYR 20CC LL (SYRINGE) ×3 IMPLANT
TRAY FOLEY CATH SILVER 16FR (SET/KITS/TRAYS/PACK) ×3 IMPLANT
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
## 2016-12-06 NOTE — Anesthesia Postprocedure Evaluation (Signed)
Anesthesia Post Note  Patient: Evan Landry  Procedure(s) Performed: EXPLORATORY LAPAROTOMY (N/A ) LYSIS OF ADHESION (N/A ) SMALL BOWEL RESECTION (N/A Abdomen)  Patient location during evaluation: PACU Anesthesia Type: General Level of consciousness: awake and alert and patient cooperative Pain control: 7/10. Vital Signs Assessment: post-procedure vital signs reviewed and stable Respiratory status: spontaneous breathing Cardiovascular status: stable Postop Assessment: no apparent nausea or vomiting Anesthetic complications: no     Last Vitals:  Vitals:   12/06/16 1130 12/06/16 1145  BP: 137/87 138/87  Pulse: 91 97  Resp: (!) 21 18  Temp:    SpO2: 97% 98%    Last Pain:  Vitals:   12/06/16 1145  TempSrc:   PainSc: 9                  Charlisha Market

## 2016-12-06 NOTE — Progress Notes (Signed)
Per verbal order from Dr Henreitta LeberBridges, can clamp NG tube for 30 minutes for all PO meds.

## 2016-12-06 NOTE — Interval H&P Note (Signed)
History and Physical Interval Note:  12/06/2016 8:34 AM  Evan Landry  has presented today for surgery, with the diagnosis of small bowel stricture causing SBO  The various methods of treatment have been discussed with the patient and family. After consideration of risks, benefits and other options for treatment, the patient has consented to  Procedure(s): EXPLORATORY LAPAROTOMY, Small bowel resection (N/A) as a surgical intervention .  The patient's history has been reviewed, patient examined, no change in status, stable for surgery.  I have reviewed the patient's chart and labs.  Questions were answered to the patient's satisfaction.    Plan discussed with patient and mother. No changes.  Lucretia RoersLindsay C Bridges

## 2016-12-06 NOTE — Anesthesia Procedure Notes (Signed)
Procedure Name: Intubation Date/Time: 12/06/2016 9:27 AM Performed by: Vista Deck, CRNA Pre-anesthesia Checklist: Patient identified, Patient being monitored, Timeout performed, Emergency Drugs available and Suction available Patient Re-evaluated:Patient Re-evaluated prior to induction Oxygen Delivery Method: Circle System Utilized Preoxygenation: Pre-oxygenation with 100% oxygen Induction Type: IV induction Ventilation: Mask ventilation without difficulty Laryngoscope Size: Mac and 3 Grade View: Grade II Tube type: Oral Tube size: 8.0 mm Number of attempts: 1 Airway Equipment and Method: stylet and Oral airway Placement Confirmation: ETT inserted through vocal cords under direct vision,  positive ETCO2 and breath sounds checked- equal and bilateral Secured at: 21 cm Tube secured with: Tape Dental Injury: Teeth and Oropharynx as per pre-operative assessment

## 2016-12-06 NOTE — Transfer of Care (Signed)
Immediate Anesthesia Transfer of Care Note  Patient: Evan Landry  Procedure(s) Performed: EXPLORATORY LAPAROTOMY, Small bowel resection and lysis of adhesions (N/A )  Patient Location: PACU  Anesthesia Type:General  Level of Consciousness: awake, alert  and patient cooperative  Airway & Oxygen Therapy: Patient Spontanous Breathing  Post-op Assessment: Report given to RN and Post -op Vital signs reviewed and stable  Post vital signs: Reviewed and stable  Last Vitals:  Vitals:   12/06/16 0905 12/06/16 0910  BP: (!) 144/87 122/84  Pulse:    Resp: (!) 24 20  Temp:    SpO2: 99% 97%    Last Pain:  Vitals:   12/06/16 0835  TempSrc:   PainSc: 2          Complications: No apparent anesthesia complications

## 2016-12-06 NOTE — Progress Notes (Signed)
Patient has IS from last hospital admission , approximately a week ago. His wife will bring his IS in tomorrow. I will see him tomorrow to review the use of the IS. Patient was able to perform 5 deep breaths and cough after.  He  was instructed to perform 10 deep breaths  and then cough  Q2WA until his IS arrives.
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
## 2016-12-07 DIAGNOSIS — E559 Vitamin D deficiency, unspecified: Secondary | ICD-10-CM

## 2016-12-07 LAB — BASIC METABOLIC PANEL
ANION GAP: 7 (ref 5–15)
BUN: <5 mg/dL — ABNORMAL LOW (ref 6–20)
CALCIUM: 8.5 mg/dL — AB (ref 8.9–10.3)
CO2: 27 mmol/L (ref 22–32)
Chloride: 100 mmol/L — ABNORMAL LOW (ref 101–111)
Creatinine, Ser: 0.5 mg/dL — ABNORMAL LOW (ref 0.61–1.24)
GFR calc Af Amer: >60 mL/min (ref >60)
GFR calc non Af Amer: >60 mL/min (ref >60)
GLUCOSE: 97 mg/dL (ref 65–99)
Potassium: 3.9 mmol/L (ref 3.5–5.1)
Sodium: 134 mmol/L — ABNORMAL LOW (ref 135–145)

## 2016-12-07 LAB — CBC
HEMATOCRIT: 31.7 % — AB (ref 39.0–52.0)
Hemoglobin: 10 g/dL — ABNORMAL LOW (ref 13.0–17.0)
MCH: 27.9 pg (ref 26.0–34.0)
MCHC: 31.5 g/dL (ref 30.0–36.0)
MCV: 88.5 fL (ref 78.0–100.0)
Platelets: 242 10*3/uL (ref 150–400)
RBC: 3.58 MIL/uL — ABNORMAL LOW (ref 4.22–5.81)
RDW: 13.4 % (ref 11.5–15.5)
WBC: 8.5 10*3/uL (ref 4.0–10.5)

## 2016-12-07 MED ORDER — ZOLPIDEM TARTRATE 5 MG PO TABS
5.0000 mg | ORAL_TABLET | Freq: Every evening | ORAL | Status: DC | PRN
  Administered 2016-12-07: 5 mg via ORAL
  Filled 2016-12-07: qty 1

## 2016-12-07 NOTE — Anesthesia Postprocedure Evaluation (Signed)
Anesthesia Post Note  Patient: Evan Landry  Procedure(s) Performed: EXPLORATORY LAPAROTOMY (N/A ) LYSIS OF ADHESION (N/A ) SMALL BOWEL RESECTION (N/A Abdomen)  Patient location during evaluation: Nursing Unit Anesthesia Type: General Level of consciousness: awake and alert and patient cooperative Pain management: pain level controlled Vital Signs Assessment: post-procedure vital signs reviewed and stable Respiratory status: spontaneous breathing, nonlabored ventilation and respiratory function stable Cardiovascular status: blood pressure returned to baseline Postop Assessment: no apparent nausea or vomiting Anesthetic complications: no     Last Vitals:  Vitals:   12/06/16 2055 12/07/16 0550  BP: 128/82 (!) 144/86  Pulse: (!) 115 (!) 112  Resp: 18 18  Temp: 36.8 C 36.8 C  SpO2: 97% 100%    Last Pain:  Vitals:   12/07/16 0937  TempSrc:   PainSc: 0-No pain                 [name]

## 2016-12-07 NOTE — Progress Notes (Signed)
[facility] Progress Note  1 Day Post-Op  Subjective: No major complaints. Says he was able to move around last night. He refused his  Tylenol and lovenox. I reeducated on taking tylenol scheduled and the need for his lovenox given his limited ambulation.  He reports understanding and says he did not do the lovenox due to his abdominal tenderness overnight. He reports flatus.   Objective: Vital signs in last 24 hours: Temp:  [97.5 F (36.4 C)-98.5 F (36.9 C)] 98.2 F (36.8 C) (11/17 0550) Pulse Rate:  [80-115] 112 (11/17 0550) Resp:  [14-21] 18 (11/17 0550) BP: (127-144)/(80-95) 144/86 (11/17 0550) SpO2:  [95 %-100 %] 100 % (11/17 0550) Weight:  [132 lb 11.5 oz (60.2 kg)] 132 lb 11.5 oz (60.2 kg) (11/17 0550) Last BM Date: 12/05/16  Intake/Output from previous day: 11/16 0701 - 11/17 0700 In: 1300 [I.V.:1300] Out: 1550 [Urine:1400; Emesis/NG output:100; Blood:50] Intake/Output this shift: No intake/output data recorded.  General appearance: alert, cooperative and no distress Resp: normal work breathing GI: soft, mildly distended, appropriately tender, dressing in place Extremities: warm, moves all extremities  Lab Results:  Recent Labs    12/07/16 0618  WBC 8.5  HGB 10.0*  HCT 31.7*  PLT 242   BMET Recent Labs    12/05/16 0517 12/07/16 0618  NA 137 134*  K 3.7 3.9  CL 103 100*  CO2 27 27  GLUCOSE 98 97  BUN 5* <5*  CREATININE 0.55* 0.50*  CALCIUM 9.0 8.5*    Anti-infectives: Anti-infectives (From admission, onward)   Start     Dose/Rate Route Frequency Ordered Stop   12/06/16 0832  ertapenem [facility](INVANZ) 1 g in sodium chloride 0.9 % 50 mL IVPB     1 g 100 mL/hr over 30 Minutes Intravenous On call to O.R. 12/06/16 [id number] 12/06/16 0958      Assessment/Plan: [name] is a 31 yo POD 1 s/p Ex lap, SBR for a ischemic [facility]ation [facility]. He is doing fair and passing flatus. NG output related to intake for comfort. Abdomen just  mildly distended.  -PRN for pain and scheduled tylenol -Mildly tachycardic, but metoprolol stopped by Dr. Butler Denmarkizwan, which I agree with and tachycardia from patient deconditioning, will monitor -Clears, strict I/Os, educated patient on need to go slowly and to stop intake with any hiccups or burping or bloating  -Lovenox, PT working with patient    LOS: 5 days    Lucretia RoersLindsay C Bridges 12/07/2016
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
## 2016-12-07 NOTE — Addendum Note (Signed)
Addendum  created 12/07/16 1039 by Despina HiddenIdacavage, Kischa Altice J, CRNA   Sign clinical note
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
## 2016-12-08 LAB — CBC
HCT: 29.6 % — ABNORMAL LOW (ref 39.0–52.0)
HEMOGLOBIN: 9.5 g/dL — AB (ref 13.0–17.0)
MCH: 28.1 pg (ref 26.0–34.0)
MCHC: 32.1 g/dL (ref 30.0–36.0)
MCV: 87.6 fL (ref 78.0–100.0)
Platelets: 222 10*3/uL (ref 150–400)
RBC: 3.38 MIL/uL — ABNORMAL LOW (ref 4.22–5.81)
RDW: 12.9 % (ref 11.5–15.5)
WBC: 7.2 10*3/uL (ref 4.0–10.5)

## 2016-12-08 LAB — BASIC METABOLIC PANEL
ANION GAP: 7 (ref 5–15)
BUN: <5 mg/dL — ABNORMAL LOW (ref 6–20)
CALCIUM: 8.7 mg/dL — AB (ref 8.9–10.3)
CO2: 28 mmol/L (ref 22–32)
CREATININE: 0.5 mg/dL — AB (ref 0.61–1.24)
Chloride: 101 mmol/L (ref 101–111)
GFR calc non Af Amer: >60 mL/min (ref >60)
Glucose, Bld: 93 mg/dL (ref 65–99)
Potassium: 3.6 mmol/L (ref 3.5–5.1)
SODIUM: 136 mmol/L (ref 135–145)

## 2016-12-08 LAB — RETICULOCYTES
RBC.: 3.52 MIL/uL — ABNORMAL LOW (ref 4.22–5.81)
Retic Count, Absolute: 56.3 10*3/uL (ref 19.0–186.0)
Retic Ct Pct: 1.6 % (ref 0.4–3.1)

## 2016-12-08 MED ORDER — POTASSIUM CHLORIDE CRYS ER 20 MEQ PO TBCR
40.0000 meq | EXTENDED_RELEASE_TABLET | Freq: Once | ORAL | Status: AC
  Administered 2016-12-08: 40 meq via ORAL
  Filled 2016-12-08: qty 2

## 2016-12-08 MED ORDER — POTASSIUM CHLORIDE 20 MEQ/15ML (10%) PO SOLN: 40.0000 meq | Freq: Once | ORAL | Status: DC
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
## 2016-12-09 ENCOUNTER — Encounter (HOSPITAL_COMMUNITY): Payer: Self-pay | Admitting: General Surgery

## 2016-12-09 LAB — CBC
HCT: 30.5 % — ABNORMAL LOW (ref 39.0–52.0)
HEMOGLOBIN: 9.8 g/dL — AB (ref 13.0–17.0)
MCH: 27.8 pg (ref 26.0–34.0)
MCHC: 32.1 g/dL (ref 30.0–36.0)
MCV: 86.6 fL (ref 78.0–100.0)
Platelets: 273 10*3/uL (ref 150–400)
RBC: 3.52 MIL/uL — AB (ref 4.22–5.81)
RDW: 12.8 % (ref 11.5–15.5)
WBC: 9.1 10*3/uL (ref 4.0–10.5)

## 2016-12-09 LAB — BASIC METABOLIC PANEL
ANION GAP: 7 (ref 5–15)
BUN: <5 mg/dL — ABNORMAL LOW (ref 6–20)
CHLORIDE: 101 mmol/L (ref 101–111)
CO2: 26 mmol/L (ref 22–32)
CREATININE: 0.47 mg/dL — AB (ref 0.61–1.24)
Calcium: 8.3 mg/dL — ABNORMAL LOW (ref 8.9–10.3)
GFR calc non Af Amer: >60 mL/min (ref >60)
Glucose, Bld: 126 mg/dL — ABNORMAL HIGH (ref 65–99)
POTASSIUM: 3.5 mmol/L (ref 3.5–5.1)
SODIUM: 134 mmol/L — AB (ref 135–145)

## 2016-12-09 LAB — IRON AND TIBC
Iron: 12 ug/dL — ABNORMAL LOW (ref 45–182)
Saturation Ratios: 7 % — ABNORMAL LOW (ref 17.9–39.5)
TIBC: 164 ug/dL — ABNORMAL LOW (ref 250–450)
UIBC: 152 ug/dL

## 2016-12-09 LAB — MAGNESIUM: MAGNESIUM: 1.5 mg/dL — AB (ref 1.7–2.4)

## 2016-12-09 LAB — FERRITIN: Ferritin: 462 ng/mL — ABNORMAL HIGH (ref 24–336)

## 2016-12-09 LAB — PTH-RELATED PEPTIDE: PTH-related peptide: <2 pmol/L

## 2016-12-09 LAB — VITAMIN B12: VITAMIN B 12: 912 pg/mL (ref 180–914)

## 2016-12-09 LAB — FOLATE: FOLATE: 12.1 ng/mL (ref >5.9)

## 2016-12-09 MED ORDER — FERROUS SULFATE 325 (65 FE) MG PO TABS
325.0000 mg | ORAL_TABLET | Freq: Every day | ORAL | Status: DC
  Administered 2016-12-10: 325 mg via ORAL
  Filled 2016-12-09: qty 1

## 2016-12-09 MED ORDER — VITAMIN C 500 MG PO TABS
500.0000 mg | ORAL_TABLET | Freq: Every day | ORAL | Status: DC
  Administered 2016-12-09 – 2016-12-10 (×2): 500 mg via ORAL
  Filled 2016-12-09 (×2): qty 1

## 2016-12-09 MED ORDER — POTASSIUM CHLORIDE CRYS ER 20 MEQ PO TBCR
40.0000 meq | EXTENDED_RELEASE_TABLET | Freq: Two times a day (BID) | ORAL | Status: DC
  Administered 2016-12-09 – 2016-12-10 (×3): 40 meq via ORAL
  Filled 2016-12-09 (×3): qty 2

## 2016-12-09 MED ORDER — METOPROLOL TARTRATE 25 MG PO TABS
12.5000 mg | ORAL_TABLET | Freq: Two times a day (BID) | ORAL | Status: DC
  Administered 2016-12-09 – 2016-12-10 (×2): 12.5 mg via ORAL
  Filled 2016-12-09 (×2): qty 1

## 2016-12-09 MED ORDER — GABAPENTIN 100 MG PO CAPS
100.0000 mg | ORAL_CAPSULE | Freq: Three times a day (TID) | ORAL | Status: DC
  Administered 2016-12-09 – 2016-12-10 (×3): 100 mg via ORAL
  Filled 2016-12-09 (×3): qty 1
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
## 2016-12-10 DIAGNOSIS — E559 Vitamin D deficiency, unspecified: Secondary | ICD-10-CM

## 2016-12-10 LAB — BASIC METABOLIC PANEL
ANION GAP: 7 (ref 5–15)
BUN: <5 mg/dL — ABNORMAL LOW (ref 6–20)
CALCIUM: 9.1 mg/dL (ref 8.9–10.3)
CO2: 26 mmol/L (ref 22–32)
Chloride: 102 mmol/L (ref 101–111)
Creatinine, Ser: 0.45 mg/dL — ABNORMAL LOW (ref 0.61–1.24)
GFR calc Af Amer: >60 mL/min (ref >60)
GFR calc non Af Amer: >60 mL/min (ref >60)
GLUCOSE: 96 mg/dL (ref 65–99)
Potassium: 4.1 mmol/L (ref 3.5–5.1)
Sodium: 135 mmol/L (ref 135–145)

## 2016-12-10 LAB — CBC
HEMATOCRIT: 30.5 % — AB (ref 39.0–52.0)
HEMOGLOBIN: 9.8 g/dL — AB (ref 13.0–17.0)
MCH: 28.2 pg (ref 26.0–34.0)
MCHC: 32.1 g/dL (ref 30.0–36.0)
MCV: 87.6 fL (ref 78.0–100.0)
Platelets: 293 10*3/uL (ref 150–400)
RBC: 3.48 MIL/uL — ABNORMAL LOW (ref 4.22–5.81)
RDW: 13.3 % (ref 11.5–15.5)
WBC: 6.9 10*3/uL (ref 4.0–10.5)

## 2016-12-10 LAB — MAGNESIUM: MAGNESIUM: 1.5 mg/dL — AB (ref 1.7–2.4)

## 2016-12-10 MED ORDER — ZOLPIDEM TARTRATE 5 MG PO TABS: 5.0000 mg | ORAL_TABLET | Freq: Every evening | ORAL | 0 refills | Status: DC | PRN

## 2016-12-10 MED ORDER — ASCORBIC ACID 500 MG PO TABS: 500.0000 mg | ORAL_TABLET | Freq: Every day | ORAL | 3 refills | Status: AC

## 2016-12-10 MED ORDER — OXYCODONE HCL 5 MG PO TABS
5.0000 mg | ORAL_TABLET | ORAL | 0 refills | Status: DC | PRN
Start: 2016-12-10 — End: 2016-12-10

## 2016-12-10 MED ORDER — POLYETHYLENE GLYCOL 3350 17 G PO PACK: 17.0000 g | PACK | Freq: Every day | ORAL | 0 refills | Status: AC | PRN

## 2016-12-10 MED ORDER — OXYCODONE HCL 5 MG PO TABS: 5.0000 mg | ORAL_TABLET | ORAL | 0 refills | Status: DC | PRN

## 2016-12-10 MED ORDER — NIFEREX PO TABS: 150.0000 mg | ORAL_TABLET | Freq: Every day | ORAL | 1 refills | Status: AC

## 2016-12-10 MED ORDER — VITAMIN D (ERGOCALCIFEROL) 1.25 MG (50000 UNIT) PO CAPS: 50000.0000 [IU] | ORAL_CAPSULE | ORAL | 0 refills | Status: AC

## 2016-12-10 MED ORDER — METOPROLOL TARTRATE 25 MG PO TABS: 12.5000 mg | ORAL_TABLET | Freq: Two times a day (BID) | ORAL | 0 refills | Status: AC

## 2016-12-10 MED ORDER — GABAPENTIN 100 MG PO CAPS: 100.0000 mg | ORAL_CAPSULE | Freq: Three times a day (TID) | ORAL | 0 refills | Status: AC

## 2016-12-10 MED ORDER — MAGNESIUM SULFATE 2 GM/50ML IV SOLN
2.0000 g | Freq: Once | INTRAVENOUS | Status: AC
  Administered 2016-12-10: 2 g via INTRAVENOUS
  Filled 2016-12-10: qty 50

## 2016-12-10 NOTE — Progress Notes (Signed)
Pt's IV catheter removed and intact. Pt's IV site clean dry and intact. Discharge instructions including medications and follow up appointments were reviewed and discussed with patient and his mother. Al questions were answered and no further questions at this time. Pt in stable condition and in no acute distress at time of discharge. Pt escorted by nurse tech.

## 2016-12-10 NOTE — Care Management Note (Signed)
Case Management Note  Patient Details  Name: Evan Landry MRN: [id number] Date of Birth: 08/12/1985  Expected Discharge Date:  12/10/16               Expected Discharge Plan:  Home w Home Health Services  In-House Referral:  NA  Discharge planning Services  CM Consult  Post Acute Care Choice:  Home Health, Resumption of Svcs/PTA Provider Choice offered to:  Patient  [facility]  [facility]  Status of Service:  Completed, signed off  If discussed at Long Length of Stay Meetings, dates discussed: 12/10/2016   Additional Comments: Patient discharging home today. HH will resume services. [facility]. Pt aware HH has 48 hrs to make resumption visit. Bonita QuinLinda, [facility], aware of DC home today and will pull info. Pt's mother in room for transport home. No further CM needs noted or communicated prior to DC.   Malcolm Metrohildress, Lillian Tigges Demske, RN 12/10/2016, 10:02 AM

## 2016-12-10 NOTE — Discharge Instructions (Signed)
Discharge Instructions: Shower per your regular routine. Take tylenol and ibuprofen as needed for pain control, alternating every 4-6 hours.  Take Roxicodone for breakthrough pain. Take colace for constipation related to narcotic pain medication. Do not pick at the staples on your incision sites and cover as needed with a dressing.  Gabapentin capsules or tablets What is this medicine? GABAPENTIN (GA ba pen tin) is used to control partial seizures in adults with epilepsy. It is also used to treat certain types of nerve pain. This medicine may be used for other purposes; ask your health care provider or pharmacist if you have questions. COMMON BRAND NAME(S): Active-PAC with Gabapentin, Gabarone, Neurontin What should I tell my health care provider before I take this medicine? They need to know if you have any of these conditions: -kidney disease -suicidal thoughts, plans, or attempt; a previous suicide attempt by you or a family member -an unusual or allergic reaction to gabapentin, other medicines, foods, dyes, or preservatives -pregnant or trying to get pregnant -breast-feeding How should I use this medicine? Take this medicine by mouth with a glass of water. Follow the directions on the prescription label. You can take it with or without food. If it upsets your stomach, take it with food.Take your medicine at regular intervals. Do not take it more often than directed. Do not stop taking except on your doctor's advice. If you are directed to break the 600 or 800 mg tablets in half as part of your dose, the extra half tablet should be used for the next dose. If you have not used the extra half tablet within 28 days, it should be thrown away. A special MedGuide will be given to you by the pharmacist with each prescription and refill. Be sure to read this information carefully each time. Talk to your pediatrician regarding the use of this medicine in children. Special care may be  needed. Overdosage: If you think you have taken too much of this medicine contact a poison control center or emergency room at once. NOTE: This medicine is only for you. Do not share this medicine with others. What if I miss a dose? If you miss a dose, take it as soon as you can. If it is almost time for your next dose, take only that dose. Do not take double or extra doses. What may interact with this medicine? Do not take this medicine with any of the following medications: -other gabapentin products This medicine may also interact with the following medications: -alcohol -antacids -antihistamines for allergy, cough and cold -certain medicines for anxiety or sleep -certain medicines for depression or psychotic disturbances -homatropine; hydrocodone -naproxen -narcotic medicines (opiates) for pain -phenothiazines like chlorpromazine, mesoridazine, prochlorperazine, thioridazine This list may not describe all possible interactions. Give your health care provider a list of all the medicines, herbs, non-prescription drugs, or dietary supplements you use. Also tell them if you smoke, drink alcohol, or use illegal drugs. Some items may interact with your medicine. What should I watch for while using this medicine? Visit your doctor or health care professional for regular checks on your progress. You may want to keep a record at home of how you feel your condition is responding to treatment. You may want to share this information with your doctor or health care professional at each visit. You should contact your doctor or health care professional if your seizures get worse or if you have any new types of seizures. Do not stop taking this medicine or any  of your seizure medicines unless instructed by your doctor or health care professional. Stopping your medicine suddenly can increase your seizures or their severity. Wear a medical identification bracelet or chain if you are taking this medicine for  seizures, and carry a card that lists all your medications. You may get drowsy, dizzy, or have blurred vision. Do not drive, use machinery, or do anything that needs mental alertness until you know how this medicine affects you. To reduce dizzy or fainting spells, do not sit or stand up quickly, especially if you are an older patient. Alcohol can increase drowsiness and dizziness. Avoid alcoholic drinks. Your mouth may get dry. Chewing sugarless gum or sucking hard candy, and drinking plenty of water will help. The use of this medicine may increase the chance of suicidal thoughts or actions. Pay special attention to how you are responding while on this medicine. Any worsening of mood, or thoughts of suicide or dying should be reported to your health care professional right away. Women who become pregnant while using this medicine may enroll in the [place] Antiepileptic Drug Pregnancy Registry by calling 212 [phone]. This registry collects information about the safety of antiepileptic drug use during pregnancy. What side effects may I notice from receiving this medicine? Side effects that you should report to your doctor or health care professional as soon as possible: -allergic reactions like skin rash, itching or hives, swelling of the face, lips, or tongue -worsening of mood, thoughts or actions of suicide or dying Side effects that usually do not require medical attention (report to your doctor or health care professional if they continue or are bothersome): -constipation -difficulty walking or controlling muscle movements -dizziness -nausea -slurred speech -tiredness -tremors -weight gain This list may not describe all possible side effects. Call your doctor for medical advice about side effects. You may report side effects to FDA at 1-800-FDA-1088. Where should I keep my medicine? Keep out of reach of children. This medicine may cause accidental overdose and death if it taken by  other adults, children, or pets. Mix any unused medicine with a substance like cat litter or coffee grounds. Then throw the medicine away in a sealed container like a sealed bag or a coffee can with a lid. Do not use the medicine after the expiration date. Store at room temperature between 15 and 30 degrees C (59 and 86 degrees F). NOTE: This sheet is a summary. It may not cover all possible information. If you have questions about this medicine, talk to your doctor, pharmacist, or health care provider.  2018 Elsevier/Gold Standard (2013-03-05 15:26:50) Iron Deficiency Anemia, Adult Iron-deficiency anemia is when you have a low amount of red blood cells or hemoglobin. This happens because you have too little iron in your body. Hemoglobin carries oxygen to parts of the body. Anemia can cause your body to not get enough oxygen. It may or may not cause symptoms. Follow these instructions at home: Medicines  Take over-the-counter and prescription medicines only as told by your doctor. This includes iron pills (supplements) and vitamins.  If you cannot handle taking iron pills by mouth, ask your doctor about getting iron through: ? A vein (intravenously). ? A shot (injection) into a muscle.  Take iron pills when your stomach is empty. If you cannot handle this, take them with food.  Do not drink milk or take antacids at the same time as your iron pills.  To prevent trouble pooping (constipation), eat fiber or take medicine (stool softener)  as told by your doctor. Eating and drinking  Talk with your doctor before changing the foods you eat. He or she may tell you to eat foods that have a lot of iron, such as: ? Liver. ? Lowfat (lean) beef. ? Breads and cereals that have iron added to them (fortified breads and cereals). ? Eggs. ? Dried fruit. ? Dark green, leafy vegetables.  Drink enough fluid to keep your pee (urine) clear or pale yellow.  Eat fresh fruits and vegetables that are high in  vitamin C. They help your body to use iron. Foods with a lot of vitamin C include: ? Oranges. ? Peppers. ? Tomatoes. ? Mangoes. General instructions  Return to your normal activities as told by your doctor. Ask your doctor what activities are safe for you.  Keep yourself clean, and keep things clean around you (your surroundings). Anemia can make you get sick more easily.  Keep all follow-up visits as told by your doctor. This is important. Contact a doctor if:  You feel sick to your stomach (nauseous).  You throw up (vomit).  You feel weak.  You are sweating for no clear reason.  You have trouble pooping, such as: ? Pooping (having a bowel movement) less than 3 times a week. ? Straining to poop. ? Having poop that is hard, dry, or larger than normal. ? Feeling full or bloated. ? Pain in the lower belly. ? Not feeling better after pooping. Get help right away if:  You pass out (faint). If this happens, do not drive yourself to the hospital. Call your local emergency services (911 in the [place]  You have chest pain.  You have shortness of breath that: ? Is very bad. ? Gets worse with physical activity.  You have a fast heartbeat.  You get light-headed when getting up from sitting or lying down. This information is not intended to replace advice given to you by your health care provider. Make sure you discuss any questions you have with your health care provider. Document Released: 02/09/2010 Document Revised: 09/27/2015 Document Reviewed: 09/27/2015 Elsevier Interactive Patient Education  2017 Elsevier Inc. Vitamin D Deficiency Vitamin D deficiency is when your body does not have enough vitamin D. Vitamin D is important because:  It helps your body use other minerals that your body needs.  It helps keep your bones strong and healthy.  It may help to prevent some diseases.  It helps your heart and other muscles work well.  You can get vitamin D by:  Eating  foods with vitamin D in them.  Drinking or eating milk or other foods that have had vitamin D added to them.  Taking a vitamin D supplement.  Being in the sun.  Not getting enough vitamin D can make your bones become soft. It can also cause other health problems. Follow these instructions at home:  Take medicines and supplements only as told by your doctor.  Eat foods that have vitamin D. These include: ? Dairy products, cereals, or juices with added vitamin D. Check the label for vitamin D. ? Fatty fish like salmon or trout. ? Eggs. ? Oysters.  Do not use tanning beds.  Stay at a healthy weight. Lose weight, if needed.  Keep all follow-up visits as told by your doctor. This is important. Contact a doctor if:  Your symptoms do not go away.  You feel sick to your stomach (nauseous).  Youthrow up (vomit).  You poop less often than usual or you  have trouble pooping (constipation). This information is not intended to replace advice given to you by your health care provider. Make sure you discuss any questions you have with your health care provider. Document Released: 12/27/2010 Document Revised: 06/15/2015 Document Reviewed: 05/25/2014 Elsevier Interactive Patient Education  2018 [employer] Exploratory Laparotomy, Adult, Care After Refer to this sheet in the next few weeks. These instructions provide you with information about caring for yourself after your procedure. Your health care provider may also give you more specific instructions. Your treatment has been planned according to current medical practices, but problems sometimes occur. Call your health care provider if you have any problems or questions after your procedure. What can I expect after the procedure? After your procedure, it is typical to have:  Abdominal soreness.  Fatigue.  A sore throat from tubes in your throat.  A lack of appetite.  Follow these instructions at home: Medicines  Take medicines  only as directed by your health care provider.  Do not drive or operate heavy machinery while taking pain medicine. Incision care  There are many different ways to close and cover an incision, including stitches (sutures), skin glue, and adhesive strips. Follow your health care provider's instructions about: ? Incision care. ? Bandage (dressing) changes and removal. ? Incision closure removal.  Do not take showers or baths until your health care provider says that you can.  Check your incision area daily for signs of infection. Watch for: ? Redness. ? Tenderness. ? Swelling. ? Drainage. Activity  Do not lift anything that is heavier than 10 pounds (4.5 kg) until your health care provider says that it is safe.  Try to walk a little bit each day if your health care provider says that it is okay.  Ask your health care provider when you can start to do your usual activities again, such as driving, going back to work, and having sex. Eating and drinking  You may eat what you usually eat. Include lots of whole grains, fruits, and vegetables in your diet. This will help to prevent constipation.  Drink enough fluid to keep your urine clear or pale yellow. General instructions  Keep all follow-up visits as directed by your health care provider. This is important. Contact a health care provider if:  You have a fever.  You have chills.  Your pain medicine is not helping.  You have constipation or diarrhea.  You have nausea or vomiting.  You have drainage, redness, swelling, or pain at your incision site. Get help right away if:  Your pain is getting worse.  It has been more than 3 days since you been able to have a bowel movement.  You have ongoing (persistent) vomiting.  The edges of your incision open up.  You have warmth, tenderness, and swelling in your calf.  You have trouble breathing.  You have chest pain. This information is not intended to replace advice  given to you by your health care provider. Make sure you discuss any questions you have with your health care provider. Document Released: 08/22/2003 Document Revised: 06/15/2015 Document Reviewed: 08/25/2013 Elsevier Interactive Patient Education  2018 [employer]

## 2016-12-10 NOTE — Discharge Summary (Signed)
Physician Discharge Summary  Evan Landry ID: Evan Evan Landry MRN: [id number] DOB/AGE: 1985/10/24 31 y.o.  Admit date: 12/02/2016 Discharge date: 12/10/2016  Admission Diagnoses: SBO  Discharge Diagnoses:  Active Problems:   Abnormal alkaline phosphatase test   Tachycardia   Hypercalcemia   Small bowel stricture (HCC)   Vitamin D deficiency SBO from ischemic perforation and stricture of bowel after [facility]   Discharged Condition: good  Hospital Course: Evan Evan Landry is a 31 yo who was involved in a [facility] 10/13 and suffered multiple orthopedic injuries. His abdominal CT scan at that time showed only a small liver laceration, and no further intraabdominal injuries.  Evan Evan Landry did fair during his hospitalization for Evan Evan Landry but suffered from a prolonged ileus, documented on Xray, and had to receive TPN.  Evan Evan Landry went to rehab and was then discharged, and came to [facility] about 4 days after his discharge with nausea/vomiting and abdominal pain.  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  Given this, Evan Evan Landry was taken to [facility] and an exploration revealed a small perforation that was contained in a cavity in Evan left hemipelvis.  Evan small bowel was resected, and a primary anastomosis was performed.  Evan Evan Landry did fair post operatively. Evan Evan Landry was having BMs and tolerating a diet. Evan Evan Landry had good pain control, and was participating with PT per Evan orthopedic orders.  Evan Evan Landry was discharged home in good condition, and his home health that had been set up prior to continued for his orthopedic injuries given that Evan Evan Landry was still non weight bearing in his lower extremities.   During [facility], [facility] Evan Landry had been placed on a beta blocker. After much discussion with Evan hospitalist, we decided to taper this down so that Evan Evan Landry did not  have any rebound tachycardia. Evan Evan Landry was told to take 12.5mg  BID instead of Evan 25mg  BID and was told to see a PCP regarding coming off this medication.  Evan Evan Landry was also found to have iron deficiency anemia and Vit D deficiency, and was placed on medications for this.  Evan Evan Landry plans to get a PCP in [place] closer to his home.  Consults: hospitalist   Significant Diagnostic Studies: CT a/p with rectal and oral contrast   Treatments: Exploratory laparotomy with small bowel resection, primary side to side stapled anastomosis   Discharge Exam: Blood pressure 129/85, pulse 91, temperature 98.7 F (37.1 C), temperature source Oral, resp. rate 18, height 5\' 6"  (1.676 m), weight 131 lb 6.7 oz (59.6 kg), SpO2 100 %. General appearance: alert, cooperative and no distress Resp: normal work breathing GI: soft, minimally distended, non tender, staples, c/d/i with no erythema Landry drainage Extremities: warm, Moves all extremities, brace on left wrist   Disposition: 06-Home-Health Care Svc  Discharge Instructions    Call MD for:  difficulty breathing, headache Landry visual disturbances   Complete by:  As directed    Call MD for:  extreme fatigue   Complete by:  As directed    Call MD for:  persistant dizziness Landry light-headedness   Complete by:  As directed    Call MD for:  persistant nausea and vomiting   Complete by:  As directed    Call MD for:  redness, tenderness, Landry signs of infection (pain, swelling, redness, odor Landry green/yellow discharge around incision site)  Complete by:  As directed    Call MD for:  severe uncontrolled pain   Complete by:  As directed    Call MD for:  temperature >100.4   Complete by:  As directed    Diet - low sodium heart healthy   Complete by:  As directed    Discharge instructions   Complete by:  As directed    Stop taking Evan baclofen and start taking Evan neurontin, this will help more with Evan nerve pain. Dr. Gwenlyn PerkingMadera is concerned about stopping Evan metoprolol without  tapering it down, take 1/2 of Evan tablet of your metoprolol (so cut Evan 25 mg tablet in half and take 12.5mg ) and take that twice daily.  See a primary care doctor to discuss tapering Evan metoprolol off further.  Take Vitamin D [id number] U once weekly for low Vitamin D. Take Evan Iron combination and Vitamin C for your anemia (low blood counts). Please find a Primary Care Doctor, and get an appointment for Evan next week.  Stop taking Evan reglan as you should not need this any more now that your intestines are not blocked.   Increase activity slowly   Complete by:  As directed      Allergies as of 12/10/2016      Reactions   Penicillins Swelling   Has Evan Landry had a PCN reaction causing immediate rash, facial/tongue/throat swelling, SOB Landry lightheadedness with hypotension: No Has Evan Landry had a PCN reaction causing severe rash involving mucus membranes Landry skin necrosis: No Has Evan Landry had a PCN reaction that required hospitalization: No Has Evan Landry had a PCN reaction occurring within Evan last 10 years: No If all of Evan above answers are "NO", then may proceed with Cephalosporin use.      Medication List    STOP taking these medications   baclofen 10 MG tablet Commonly known as:  LIORESAL   metoCLOPramide 5 MG tablet Commonly known as:  REGLAN     TAKE these medications   ascorbic acid 500 MG tablet Commonly known as:  VITAMIN C Take 1 tablet (500 mg total) daily by mouth.   docusate sodium 100 MG capsule Commonly known as:  COLACE Take 1 capsule (100 mg total) 2 (two) times daily by mouth.   enoxaparin 40 MG/0.4ML injection Commonly known as:  LOVENOX Inject 0.4 mLs (40 mg total) daily into Evan skin.   gabapentin 100 MG capsule Commonly known as:  NEURONTIN Take 1 capsule (100 mg total) 3 (three) times daily by mouth.   metoprolol tartrate 25 MG tablet Commonly known as:  LOPRESSOR Take 0.5 tablets (12.5 mg total) 2 (two) times daily by mouth. What changed:  how much to  take   NIFEREX Tabs Take 150 mg daily by mouth.   oxyCODONE 5 MG immediate release tablet Commonly known as:  Oxy IR/ROXICODONE Take 1 tablet (5 mg total) every 4 (four) hours as needed by mouth for severe pain Landry breakthrough pain. What changed:    how much to take  reasons to take this   pantoprazole 40 MG tablet Commonly known as:  PROTONIX Take 1 tablet (40 mg total) daily by mouth.   polyethylene glycol packet Commonly known as:  MIRALAX / GLYCOLAX Take 17 g daily as needed by mouth for moderate constipation. What changed:    when to take this  reasons to take this   Vitamin D (Ergocalciferol) 50000 units Caps capsule Commonly known as:  DRISDOL Take 1 capsule (50,000 Units total) every 7 (  seven) days by mouth.      Follow-up Information    Lucretia RoersBridges, Aleen Marston C, MD On 12/24/2016.   Specialty:  General Surgery Why:  at 10:15 am Contact information: [address] [place] [facility] [place] [id number] [phone]        Primary Care Physician in your area. Schedule an appointment as soon as possible for a visit in 1 week(s).   Why:  Need to follow you for Evan low blood counts (anemia), Vit D deficiency, and getting you off Evan heart medication (beta blocker) for your heart rate, continuing to taper it off.           Signed: Lucretia RoersLindsay C Keeara Frees 12/10/2016, 10:19 AM
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
## 2016-12-11 ENCOUNTER — Telehealth: Payer: Self-pay | Admitting: General Surgery

## 2016-12-11 NOTE — Telephone Encounter (Signed)
Let his home health RN.   Discussed with her regarding that I would sign for home health, but his PT, lovenox and weight bearing is per the orthopedic surgeon.  I will defer to them for the PT needs and lovenox and weight bearing.   Algis GreenhouseLindsay Marilee Ditommaso, MD [facility] [address] [place], [place] [id number] [phone] (office)

## 2016-12-24 ENCOUNTER — Encounter: Payer: Self-pay | Admitting: General Surgery

## 2016-12-24 ENCOUNTER — Ambulatory Visit (INDEPENDENT_AMBULATORY_CARE_PROVIDER_SITE_OTHER): Payer: Self-pay | Admitting: General Surgery

## 2016-12-24 VITALS — BP 130/85 | HR 85 | Temp 98.7°F | Resp 18 | Ht 67.0 in | Wt 132.0 lb

## 2016-12-24 DIAGNOSIS — K56699 Other intestinal obstruction unspecified as to partial versus complete obstruction: Secondary | ICD-10-CM
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
## 2016-12-25 ENCOUNTER — Encounter (HOSPITAL_COMMUNITY): Payer: Self-pay | Admitting: Emergency Medicine

## 2016-12-25 ENCOUNTER — Emergency Department (HOSPITAL_COMMUNITY): Payer: BLUE CROSS/BLUE SHIELD

## 2016-12-25 ENCOUNTER — Emergency Department (HOSPITAL_COMMUNITY)
Admission: EM | Admit: 2016-12-25 | Discharge: 2016-12-25 | Disposition: A | Discharge status: Home / Self Care | Payer: BLUE CROSS/BLUE SHIELD | Attending: Emergency Medicine | Admitting: Emergency Medicine

## 2016-12-25 ENCOUNTER — Other Ambulatory Visit: Payer: Self-pay

## 2016-12-25 DIAGNOSIS — Z79899 Other long term (current) drug therapy: Secondary | ICD-10-CM | POA: Insufficient documentation

## 2016-12-25 DIAGNOSIS — R079 Chest pain, unspecified: Secondary | ICD-10-CM | POA: Insufficient documentation

## 2016-12-25 LAB — BASIC METABOLIC PANEL
Anion gap: 8 (ref 5–15)
BUN: 6 mg/dL (ref 6–20)
CALCIUM: 9.8 mg/dL (ref 8.9–10.3)
CO2: 30 mmol/L (ref 22–32)
CREATININE: 0.56 mg/dL — AB (ref 0.61–1.24)
Chloride: 102 mmol/L (ref 101–111)
GFR calc non Af Amer: >60 mL/min (ref >60)
GLUCOSE: 112 mg/dL — AB (ref 65–99)
Potassium: 3.7 mmol/L (ref 3.5–5.1)
Sodium: 140 mmol/L (ref 135–145)

## 2016-12-25 LAB — CBC WITH DIFFERENTIAL/PLATELET
BASOS ABS: 0 10*3/uL (ref 0.0–0.1)
BASOS PCT: 0 %
EOS ABS: 0.1 10*3/uL (ref 0.0–0.7)
EOS PCT: 1 %
HCT: 37.5 % — ABNORMAL LOW (ref 39.0–52.0)
Hemoglobin: 11.5 g/dL — ABNORMAL LOW (ref 13.0–17.0)
Lymphocytes Relative: 16 %
Lymphs Abs: 1.3 10*3/uL (ref 0.7–4.0)
MCH: 27.1 pg (ref 26.0–34.0)
MCHC: 30.7 g/dL (ref 30.0–36.0)
MCV: 88.2 fL (ref 78.0–100.0)
MONO ABS: 0.6 10*3/uL (ref 0.1–1.0)
MONOS PCT: 7 %
Neutro Abs: 6.6 10*3/uL (ref 1.7–7.7)
Neutrophils Relative %: 76 %
PLATELETS: 301 10*3/uL (ref 150–400)
RBC: 4.25 MIL/uL (ref 4.22–5.81)
RDW: 12.9 % (ref 11.5–15.5)
WBC: 8.6 10*3/uL (ref 4.0–10.5)

## 2016-12-25 LAB — D-DIMER, QUANTITATIVE (NOT AT ARMC): D DIMER QUANT: 1.3 ug{FEU}/mL — AB (ref 0.00–0.50)

## 2016-12-25 MED ORDER — IOPAMIDOL (ISOVUE-370) INJECTION 76%
100.0000 mL | Freq: Once | INTRAVENOUS | Status: AC | PRN
  Administered 2016-12-25: 100 mL via INTRAVENOUS
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
## 2016-12-25 NOTE — ED Notes (Signed)
Pt states he is pain free. Now describes his pain that he did have as pressure and cramping. States started easing prior to arriving to ed

## 2016-12-25 NOTE — ED Triage Notes (Signed)
Pt c/o epigastric cp today since taking Protonix and hydrocodone today.

## 2016-12-26 ENCOUNTER — Encounter
Payer: BLUE CROSS/BLUE SHIELD | Attending: Physical Medicine & Rehabilitation | Admitting: Physical Medicine & Rehabilitation

## 2016-12-26 ENCOUNTER — Encounter: Payer: Self-pay | Admitting: Physical Medicine & Rehabilitation

## 2016-12-26 ENCOUNTER — Other Ambulatory Visit: Payer: Self-pay

## 2016-12-26 VITALS — BP 110/75 | HR 79

## 2016-12-26 DIAGNOSIS — R269 Unspecified abnormalities of gait and mobility: Secondary | ICD-10-CM

## 2016-12-26 DIAGNOSIS — T07XXXA Unspecified multiple injuries, initial encounter: Secondary | ICD-10-CM

## 2016-12-26 DIAGNOSIS — S32810S Multiple fractures of pelvis with stable disruption of pelvic ring, sequela: Secondary | ICD-10-CM | POA: Diagnosis not present

## 2016-12-26 DIAGNOSIS — M62838 Other muscle spasm: Secondary | ICD-10-CM | POA: Diagnosis not present

## 2016-12-26 DIAGNOSIS — G8918 Other acute postprocedural pain: Secondary | ICD-10-CM

## 2016-12-26 DIAGNOSIS — Z09 Encounter for follow-up examination after completed treatment for conditions other than malignant neoplasm: Secondary | ICD-10-CM | POA: Diagnosis present

## 2016-12-26 MED ORDER — BACLOFEN 10 MG PO TABS: 10.0000 mg | ORAL_TABLET | Freq: Three times a day (TID) | ORAL | 1 refills | Status: DC
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
## 2017-02-06 ENCOUNTER — Encounter
Payer: BLUE CROSS/BLUE SHIELD | Attending: Physical Medicine & Rehabilitation | Admitting: Physical Medicine & Rehabilitation

## 2017-02-06 DIAGNOSIS — R269 Unspecified abnormalities of gait and mobility: Secondary | ICD-10-CM | POA: Insufficient documentation

## 2017-02-06 DIAGNOSIS — Z09 Encounter for follow-up examination after completed treatment for conditions other than malignant neoplasm: Secondary | ICD-10-CM | POA: Insufficient documentation

## 2017-02-06 DIAGNOSIS — M62838 Other muscle spasm: Secondary | ICD-10-CM | POA: Insufficient documentation

## 2017-02-25 ENCOUNTER — Other Ambulatory Visit: Payer: Self-pay

## 2017-02-25 MED ORDER — BACLOFEN 10 MG PO TABS: 10.0000 mg | ORAL_TABLET | Freq: Three times a day (TID) | ORAL | 1 refills | Status: AC

## 2017-02-28 ENCOUNTER — Other Ambulatory Visit: Payer: Self-pay | Admitting: General Surgery

## 2019-06-30 IMAGING — CT CT 3D ACQUISTION WKST
2 of 3 series · 15 of 46 positions shown, 17 images · non-contrast
Comparison: None.

CLINICAL DATA: Nonspecific (abnormal) findings on radiological and
other examination of musculoskeletal sysem.

EXAM:
3-DIMENSIONAL CT IMAGE RENDERING ON ACQUISITION WORKSTATION
TECHNIQUE: 3-dimensional CT images were rendered by post-processing of the
original CT data on an acquisition workstation. The 3-dimensional CT
images were interpreted and findings were reported in the
accompanying complete CT report for this study

[Series 3: pelvis 2.0 st · axial · 0.80mm/px · z∈[+1112,+1324]mm · 12 of 122 slices shown, 14 images]
[im 8/122  soft-tissue]
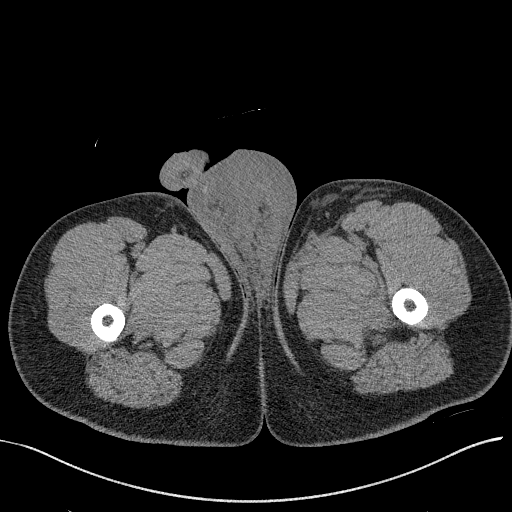
[im 8/122  bone]
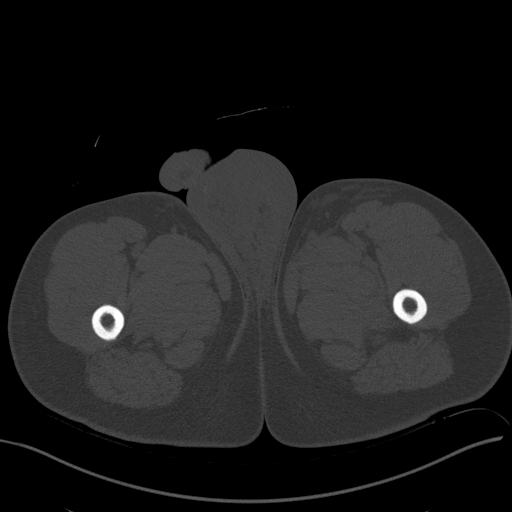
[im 16/122  soft-tissue]
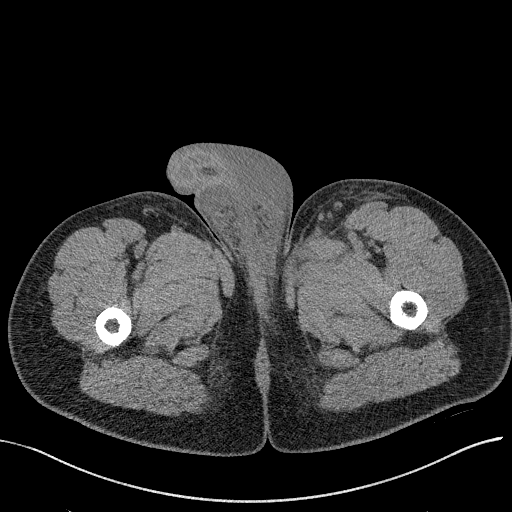
[im 28/122  soft-tissue]
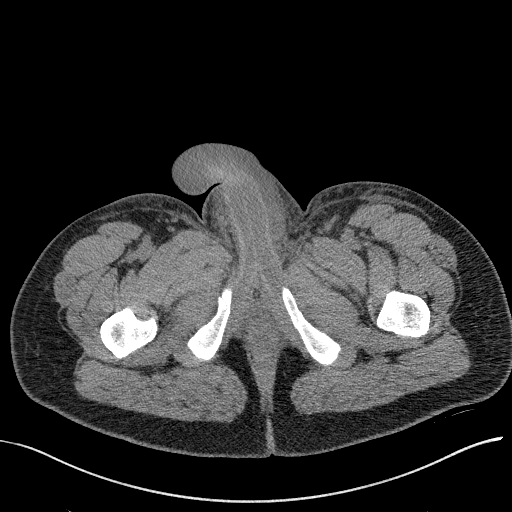
[im 36/122  soft-tissue]
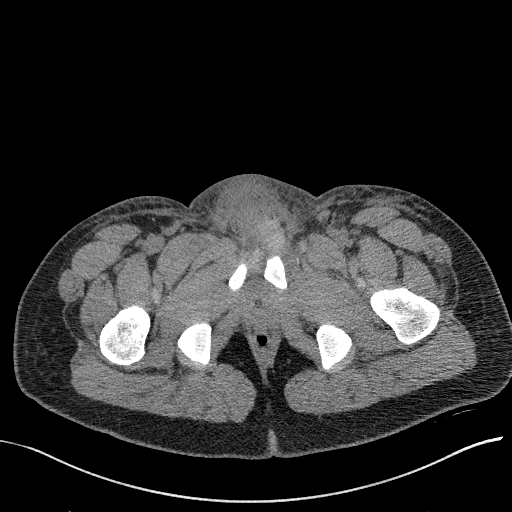
[im 47/122  soft-tissue]
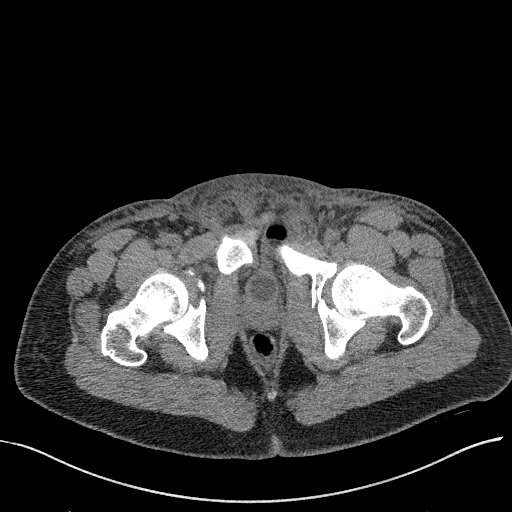
[im 55/122  soft-tissue]
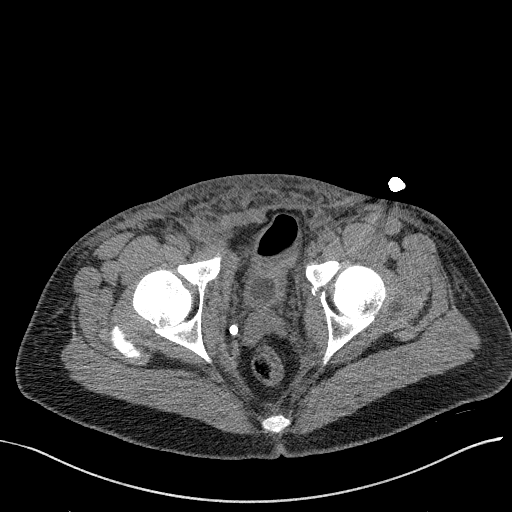
[im 67/122  soft-tissue]
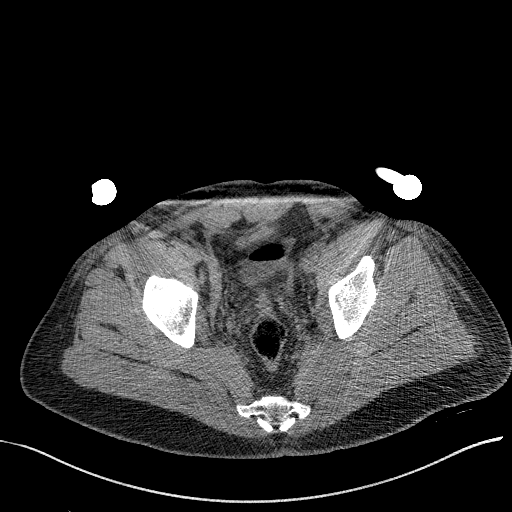
[im 75/122  soft-tissue]
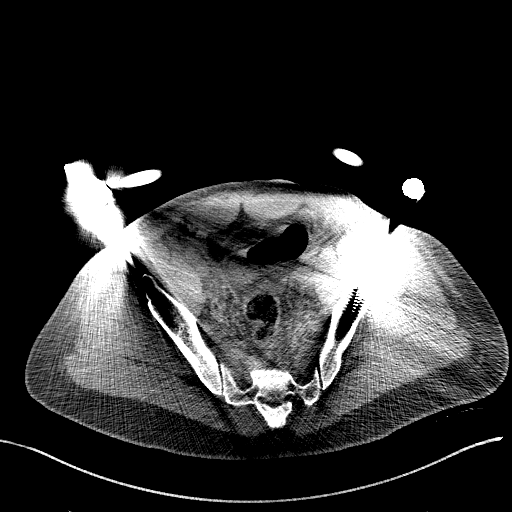
[im 86/122  soft-tissue]
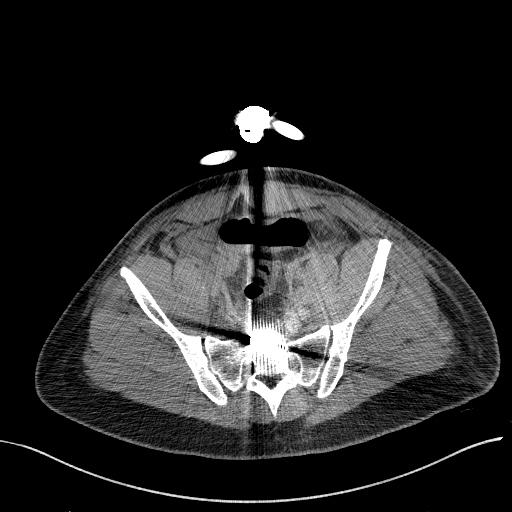
[im 86/122  bone]
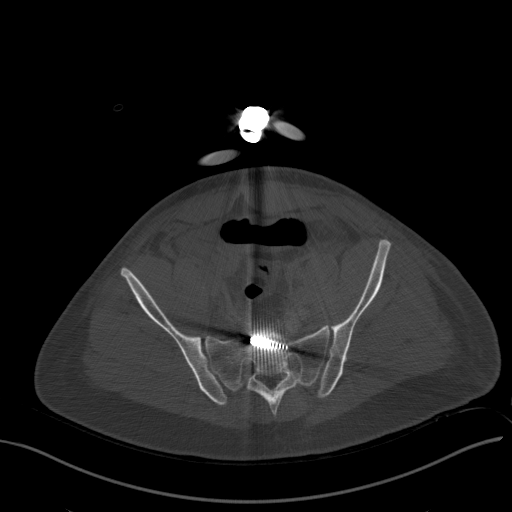
[im 94/122  soft-tissue]
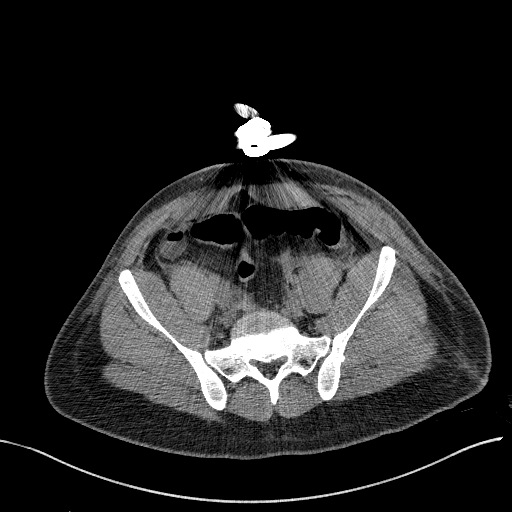
[im 106/122  soft-tissue]
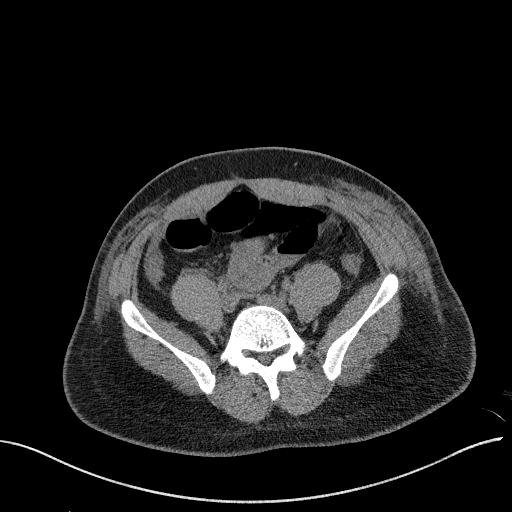
[im 114/122  soft-tissue]
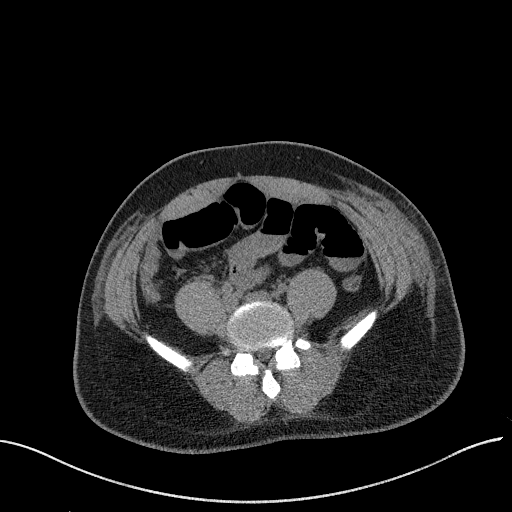

[Series 8: coronal st · coronal · 0.48mm/px · 3 of 191 slices shown]
[im 64/191  soft-tissue]
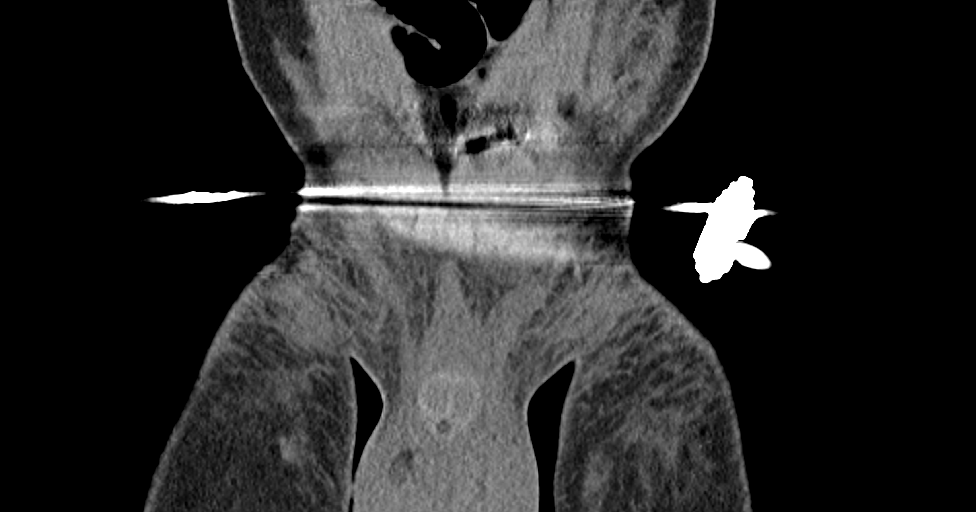
[im 85/191  soft-tissue]
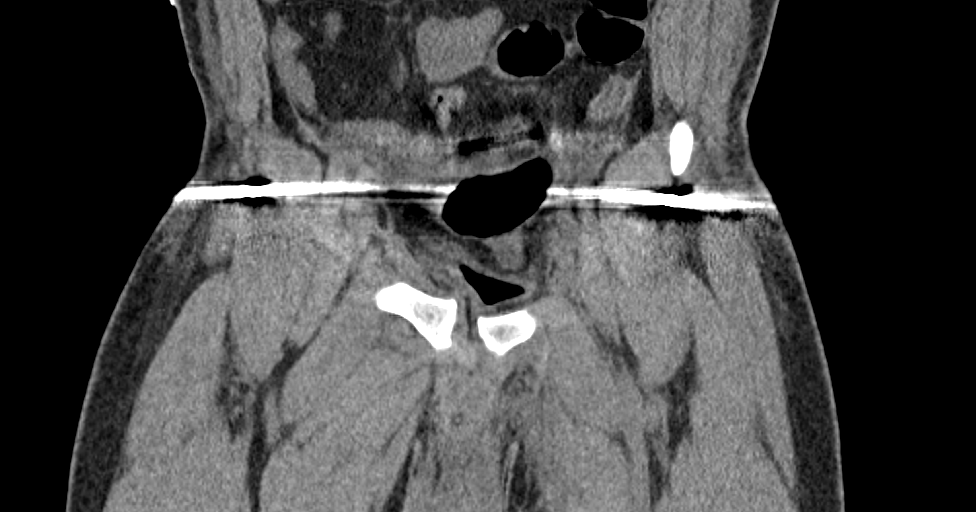
[im 106/191  soft-tissue]
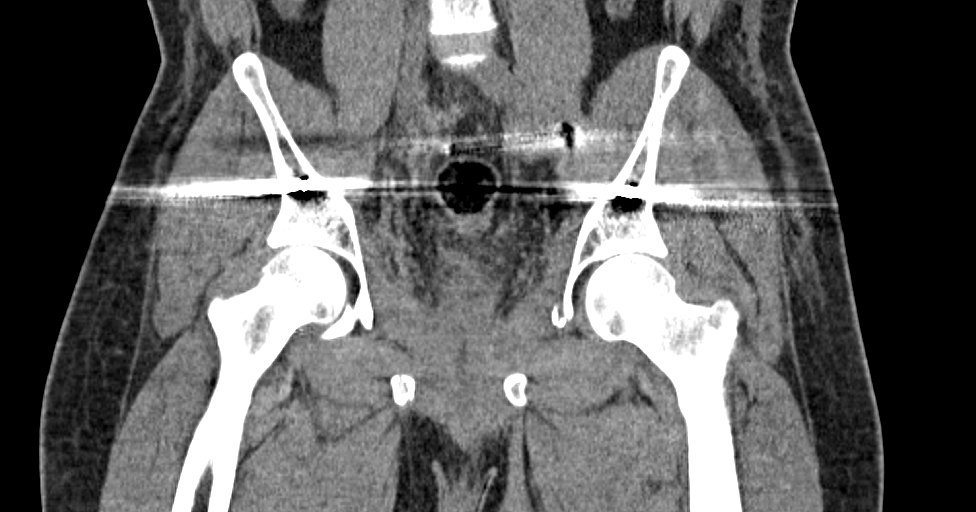

[15 of 46 positions shown; findings below may reference images not displayed]

FINDINGS: 3D reconstruction of the pelvis demonstrate reduction of pelvic
diastases with residual 10 mm separation of the pubic symphysis and
the right aspect of symphysis 11 mm superior to left aspect of
symphysis.
IMPRESSION: As above.

By: Shehnaz Mick M.D.
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
## 2021-08-25 DIAGNOSIS — X509XXA Other and unspecified overexertion or strenuous movements or postures, initial encounter: Secondary | ICD-10-CM | POA: Insufficient documentation

## 2021-08-25 DIAGNOSIS — Y9367 Activity, basketball: Secondary | ICD-10-CM | POA: Insufficient documentation

## 2021-08-25 DIAGNOSIS — J45909 Unspecified asthma, uncomplicated: Secondary | ICD-10-CM | POA: Insufficient documentation

## 2021-08-25 DIAGNOSIS — S93602A Unspecified sprain of left foot, initial encounter: Secondary | ICD-10-CM | POA: Insufficient documentation

## 2021-08-26 ENCOUNTER — Emergency Department
Admission: EM | Admit: 2021-08-26 | Discharge: 2021-08-26 | Disposition: A | Discharge status: Home / Self Care | Payer: BLUE CROSS/BLUE SHIELD | Attending: Emergency Medicine | Admitting: Emergency Medicine

## 2021-08-26 ENCOUNTER — Other Ambulatory Visit: Payer: Self-pay

## 2021-08-26 ENCOUNTER — Emergency Department: Payer: BLUE CROSS/BLUE SHIELD

## 2021-08-26 DIAGNOSIS — S93602A Unspecified sprain of left foot, initial encounter: Secondary | ICD-10-CM

## 2021-08-26 MED ORDER — IBUPROFEN 800 MG PO TABS
800.0000 mg | ORAL_TABLET | Freq: Once | ORAL | Status: AC
  Administered 2021-08-26: 800 mg via ORAL
  Filled 2021-08-26: qty 1

## 2021-08-26 NOTE — ED Triage Notes (Signed)
Pt presents to ER c/o left foot pain that happened while playing basketball around 1930.  Pt states he was playing basketball, jumped for the ball, and when he came down, felt a pop in his foot.  Pt has pain to lateral and bottom part of foot.  Pt states he is unable to put any pressure on his left foot.  Pt A&O x4 at this time in NAD in triage.
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
## 2021-08-26 NOTE — Discharge Instructions (Addendum)
You may alternate Tylenol 1000 mg every 6 hours as needed for pain, fever and Ibuprofen 800 mg every 6-8 hours as needed for pain, fever.  Please take Ibuprofen with food.  Do not take more than 4000 mg of Tylenol (acetaminophen) in a 24 hour period. ° °

## 2023-09-23 ENCOUNTER — Emergency Department

## 2023-09-23 ENCOUNTER — Emergency Department
Admission: EM | Admit: 2023-09-23 | Discharge: 2023-09-23 | Disposition: A | Discharge status: Home / Self Care | Attending: Emergency Medicine | Admitting: Emergency Medicine

## 2023-09-23 ENCOUNTER — Other Ambulatory Visit: Payer: Self-pay

## 2023-09-23 DIAGNOSIS — M545 Low back pain, unspecified: Secondary | ICD-10-CM | POA: Diagnosis not present

## 2023-09-23 DIAGNOSIS — M25561 Pain in right knee: Secondary | ICD-10-CM | POA: Diagnosis not present

## 2023-09-23 DIAGNOSIS — T07XXXA Unspecified multiple injuries, initial encounter: Secondary | ICD-10-CM

## 2023-09-23 DIAGNOSIS — J45909 Unspecified asthma, uncomplicated: Secondary | ICD-10-CM | POA: Insufficient documentation

## 2023-09-23 DIAGNOSIS — Z23 Encounter for immunization: Secondary | ICD-10-CM | POA: Insufficient documentation

## 2023-09-23 DIAGNOSIS — M25571 Pain in right ankle and joints of right foot: Secondary | ICD-10-CM

## 2023-09-23 DIAGNOSIS — S80211A Abrasion, right knee, initial encounter: Secondary | ICD-10-CM | POA: Diagnosis not present

## 2023-09-23 DIAGNOSIS — S60811A Abrasion of right wrist, initial encounter: Secondary | ICD-10-CM | POA: Insufficient documentation

## 2023-09-23 DIAGNOSIS — Y92410 Unspecified street and highway as the place of occurrence of the external cause: Secondary | ICD-10-CM | POA: Diagnosis not present

## 2023-09-23 DIAGNOSIS — S8991XA Unspecified injury of right lower leg, initial encounter: Secondary | ICD-10-CM | POA: Diagnosis present

## 2023-09-23 DIAGNOSIS — S82831A Other fracture of upper and lower end of right fibula, initial encounter for closed fracture: Secondary | ICD-10-CM | POA: Insufficient documentation

## 2023-09-23 DIAGNOSIS — S50811A Abrasion of right forearm, initial encounter: Secondary | ICD-10-CM | POA: Diagnosis not present

## 2023-09-23 MED ORDER — LIDOCAINE 5 % EX PTCH
1.0000 | MEDICATED_PATCH | CUTANEOUS | 0 refills | Status: DC
Start: 2023-09-23 — End: 2023-09-23

## 2023-09-23 MED ORDER — LIDOCAINE 5 % EX PTCH
1.0000 | MEDICATED_PATCH | CUTANEOUS | Status: DC
  Administered 2023-09-23: 1 via TRANSDERMAL
  Filled 2023-09-23: qty 1

## 2023-09-23 MED ORDER — ACETAMINOPHEN 500 MG PO TABS
1000.0000 mg | ORAL_TABLET | Freq: Once | ORAL | Status: AC
  Administered 2023-09-23: 1000 mg via ORAL
  Filled 2023-09-23: qty 2

## 2023-09-23 MED ORDER — OXYCODONE HCL 5 MG PO CAPS: 5.0000 mg | ORAL_CAPSULE | Freq: Three times a day (TID) | ORAL | 0 refills | Status: AC | PRN

## 2023-09-23 MED ORDER — TETANUS-DIPHTH-ACELL PERTUSSIS 5-2.5-18.5 LF-MCG/0.5 IM SUSY
0.5000 mL | PREFILLED_SYRINGE | Freq: Once | INTRAMUSCULAR | Status: AC
  Administered 2023-09-23: 0.5 mL via INTRAMUSCULAR
  Filled 2023-09-23: qty 0.5

## 2023-09-23 MED ORDER — LIDOCAINE 5 % EX PTCH: 1.0000 | MEDICATED_PATCH | CUTANEOUS | 0 refills | Status: AC

## 2023-09-23 MED ORDER — OXYCODONE HCL 5 MG PO TABS
5.0000 mg | ORAL_TABLET | Freq: Once | ORAL | Status: AC
  Administered 2023-09-23: 5 mg via ORAL
  Filled 2023-09-23: qty 1

## 2023-09-23 MED ORDER — OXYCODONE HCL 5 MG PO CAPS: 5.0000 mg | ORAL_CAPSULE | Freq: Three times a day (TID) | ORAL | 0 refills | Status: DC | PRN
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
## 2023-09-23 NOTE — ED Notes (Signed)
 Pt given DC instructions. Pt verbalized understanding of medications and follow up care. Pt taken from ED in wheelchair. NAD noted at departure.

## 2023-09-23 NOTE — ED Notes (Signed)
 Pt to xray at this time.
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
## 2023-10-03 ENCOUNTER — Other Ambulatory Visit: Payer: Self-pay
